# Patient Record
Sex: Female | Born: 1995 | Race: Black or African American | Hispanic: No | Marital: Single | Smoking: Never smoker
Health system: Southern US, Community
[De-identification: ages and names within clinical notes are randomized; demographics above are authoritative.]

## PROBLEM LIST (undated history)

## (undated) DIAGNOSIS — F419 Anxiety disorder, unspecified: Secondary | ICD-10-CM

## (undated) DIAGNOSIS — F32A Depression, unspecified: Secondary | ICD-10-CM

## (undated) HISTORY — PX: TONSILLECTOMY: SUR1361

## (undated) HISTORY — DX: Anxiety disorder, unspecified: F41.9

## (undated) HISTORY — PX: WISDOM TOOTH EXTRACTION: SHX21

## (undated) HISTORY — DX: Depression, unspecified: F32.A

---

## 2016-08-09 ENCOUNTER — Emergency Department (HOSPITAL_COMMUNITY)

## 2016-08-09 ENCOUNTER — Emergency Department (HOSPITAL_COMMUNITY)
Admission: EM | Admit: 2016-08-09 | Discharge: 2016-08-09 | Disposition: A | Discharge status: Home / Self Care | Attending: Emergency Medicine | Admitting: Emergency Medicine

## 2016-08-09 ENCOUNTER — Encounter (HOSPITAL_COMMUNITY): Payer: Self-pay

## 2016-08-09 DIAGNOSIS — R0789 Other chest pain: Secondary | ICD-10-CM | POA: Diagnosis present

## 2016-08-09 DIAGNOSIS — F419 Anxiety disorder, unspecified: Secondary | ICD-10-CM | POA: Insufficient documentation

## 2016-08-09 MED ORDER — ONDANSETRON 4 MG PO TBDP: 4.0000 mg | ORAL_TABLET | Freq: Three times a day (TID) | ORAL | 0 refills | Status: DC | PRN

## 2016-08-09 NOTE — ED Triage Notes (Signed)
Patient complains of right sided chest pain and shortness of breath since am, no ocls, no cough, no trauma. States increased stress

## 2016-08-09 NOTE — ED Notes (Signed)
Papers reviewed with patient, school note given and pt. Leaving with friend

## 2016-08-09 NOTE — Discharge Instructions (Signed)
Please read and follow all provided instructions.  Your diagnoses today include:  1. Chest tightness   2. Anxiety     Tests performed today include:  An EKG of your heart  A chest x-ray  Vital signs. See below for your results today.   Medications prescribed:   None  Take any prescribed medications only as directed.  Follow-up instructions: Please follow-up with your primary care provider as needed for further evaluation of your symptoms.   Please get plenty of rest over the next few days.   Return instructions:  SEEK IMMEDIATE MEDICAL ATTENTION IF:  You have severe chest pain, especially if the pain is crushing or pressure-like and spreads to the arms, back, neck, or jaw, or if you have sweating, nausea (feeling sick to your stomach), or shortness of breath. THIS IS AN EMERGENCY. Don't wait to see if the pain will go away. Get medical help at once. Call 911 or 0 (operator). DO NOT drive yourself to the hospital.   Your chest pain gets worse and does not go away with rest.   You have an attack of chest pain lasting longer than usual, despite rest and treatment with the medications your caregiver has prescribed.   You wake from sleep with chest pain or shortness of breath.  You feel dizzy or faint.  You have chest pain not typical of your usual pain for which you originally saw your caregiver.   You have any other emergent concerns regarding your health.  Additional Information: Chest pain comes from many different causes. Your caregiver has diagnosed you as having chest pain that is not specific for one problem, but does not require admission.  You are at low risk for an acute heart condition or other serious illness.   Your vital signs today were: BP 135/91 (BP Location: Right Arm)    Pulse 96    Temp 98.3 F (36.8 C) (Oral)    Resp 21    Ht 5\' 2"  (1.575 m)    Wt 106.1 kg    LMP 07/29/2016    SpO2 100%    BMI 42.76 kg/m  If your blood pressure (BP) was elevated  above 135/85 this visit, please have this repeated by your doctor within one month. --------------

## 2016-08-09 NOTE — ED Notes (Signed)
Pt speaking in complete sentences at nurse first, ambulatory with quick step.  NAD, A&O.

## 2016-08-09 NOTE — ED Provider Notes (Signed)
[facility]-EMERGENCY DEPT Provider Note   CSN: [id number] Arrival date & time: 08/09/16  1207  By signing my name below, I, Clovis PuAvnee Patel, attest that this documentation has been prepared under the direction and in the presence of  RaytheonJosh Bradie Lacock PA-C. Electronically Signed: Clovis PuAvnee Patel, ED Scribe. 08/09/16. 2:23 PM.   History   Chief Complaint Chief Complaint  Patient presents with  . Chest Pain  . Shortness of Breath   The history is provided by the patient. No language interpreter was used.   HPI Comments:  Mia Ryan is a 20 y.o. female who presents to the Emergency Department complaining of sudden onset, mild chest pain/pressure with associated SOB x which began in the AM today. Pt states she was hyperventilating and notes she has been stressed out due to finals x 4 days. She has not been sleeping much and has been drinking enerty drinks. She also endorses nausea. Pt denies fevers, cough, leg swelling, abdominal pain, hx of blood clots, birth control use, hx of hear or lungs issues, any other associated symptoms and modifying factors at this time.    History reviewed. No pertinent past medical history.  There are no active problems to display for this patient.   History reviewed. No pertinent surgical history.  OB History    No data available       Home Medications    Prior to Admission medications   Not on File    Family History No family history on file.  Social History Social History  Substance Use Topics  . Smoking status: Never Smoker  . Smokeless tobacco: Never Used  . Alcohol use Not on file     Allergies   Patient has no known allergies.   Review of Systems Review of Systems  Constitutional: Negative for diaphoresis and fever.  Eyes: Negative for redness.  Respiratory: Positive for shortness of breath. Negative for cough.   Cardiovascular: Positive for chest pain. Negative for palpitations and leg swelling.  Gastrointestinal: Positive for nausea.  Negative for abdominal pain and vomiting.  Genitourinary: Negative for dysuria.  Musculoskeletal: Negative for back pain and neck pain.  Skin: Negative for rash.  Neurological: Negative for syncope and light-headedness.  Psychiatric/Behavioral: The patient is nervous/anxious.      Physical Exam Updated Vital Signs BP 135/91 (BP Location: Right Arm)   Pulse 96   Temp 98.3 F (36.8 C) (Oral)   Resp 21   Ht 5\' 2"  (1.575 m)   Wt 233 lb 12.8 oz (106.1 kg)   LMP 07/29/2016   SpO2 100%   BMI 42.76 kg/m   Physical Exam  Constitutional: She appears well-developed and well-nourished. No distress.  HENT:  Head: Normocephalic and atraumatic.  Mouth/Throat: Oropharynx is clear and moist and mucous membranes are normal. Mucous membranes are not dry.  Eyes: Conjunctivae are normal.  Neck: Trachea normal and normal range of motion. Neck supple. Normal carotid pulses and no JVD present. No muscular tenderness present. Carotid bruit is not present. No tracheal deviation present.  Cardiovascular: Normal rate, regular rhythm, S1 normal, S2 normal, normal heart sounds and intact distal pulses.  Exam reveals no decreased pulses.   No murmur heard. Pulmonary/Chest: Effort normal. No respiratory distress. She has no wheezes. She exhibits no tenderness.  Abdominal: Soft. Normal aorta and bowel sounds are normal. She exhibits no distension. There is no tenderness. There is no rebound and no guarding.  Musculoskeletal: Normal range of motion.  Neurological: She is alert.  Skin: Skin is warm  and dry. She is not diaphoretic. No cyanosis. No pallor.  Psychiatric: She has a normal mood and affect.  Nursing note and vitals reviewed.    ED Treatments / Results  DIAGNOSTIC STUDIES:  Oxygen Saturation is 100% on RA, normal by my interpretation.    COORDINATION OF CARE:  2:21 PM Discussed EKG and CXR results with patient. Prescribed medication for nausea and advised pt to get some rest. Pt also advised  to stay away from caffeine. Discussed treatment plan with pt at bedside and pt agreed to plan.   Radiology Dg Chest 2 View  Result Date: 08/09/2016 CLINICAL DATA:  RIGHT upper chest pain. EXAM: CHEST  2 VIEW COMPARISON:  None. FINDINGS: The heart size and mediastinal contours are within normal limits. Both lungs are clear. The visualized skeletal structures are unremarkable. IMPRESSION: No active cardiopulmonary disease. Electronically Signed   By: Elsie StainJohn T Curnes M.D.   On: 08/09/2016 13:27    Procedures Procedures (including critical care time)    Initial Impression / Assessment and Plan / ED Course  I have reviewed the triage vital signs and the nursing notes.  Pertinent labs & imaging results that were available during my care of the patient were reviewed by me and considered in my medical decision making (see chart for details).  Clinical Course    Patient seen and examined.    Vital signs reviewed and are as follows: BP 135/91 (BP Location: Right Arm)   Pulse 96   Temp 98.3 F (36.8 C) (Oral)   Resp 21   Ht 5\' 2"  (1.575 m)   Wt 106.1 kg   LMP 07/29/2016   SpO2 100%   BMI 42.76 kg/m   EKG reviewed by myself.   ED ECG REPORT   Date: 08/09/2016  Rate: 88  Rhythm: normal sinus rhythm  QRS Axis: normal  Intervals: normal  ST/T Wave abnormalities: normal  Conduction Disutrbances: none  Narrative Interpretation:   Old EKG Reviewed: none available  I have personally reviewed the EKG tracing and agree with the computerized printout as noted.   Final Clinical Impressions(s) / ED Diagnoses   Final diagnoses:  Chest tightness  Anxiety   Patient with chest tightness in the setting of anxiety hyperventilation. EKG is normal. Chest x-ray is normal. Patient is under a lot of stress due to finals. She has not been sleeping much. She has large amount of caffeine intake. Suspect that her symptoms today are related to panic attack. Patient is perc negative. Do not suspect  ACS given age and presentation. Do not feel that blood work is indicated today.  New Prescriptions New Prescriptions   ONDANSETRON (ZOFRAN ODT) 4 MG DISINTEGRATING TABLET    Take 1 tablet (4 mg total) by mouth every 8 (eight) hours as needed for nausea or vomiting.  I personally performed the services described in this documentation, which was scribed in my presence. The recorded information has been reviewed and is accurate.     Renne CriglerJoshua Kevina Piloto, PA-C 08/09/16 1438    Nira ConnPedro Eduardo Cardama, MD 08/10/16 1146

## 2017-12-31 IMAGING — CR DG CHEST 2V
2 series · 2 of 2 positions shown · non-contrast
Comparison: None.

CLINICAL DATA: RIGHT upper chest pain.

EXAM:
CHEST  2 VIEW

[chest lat]
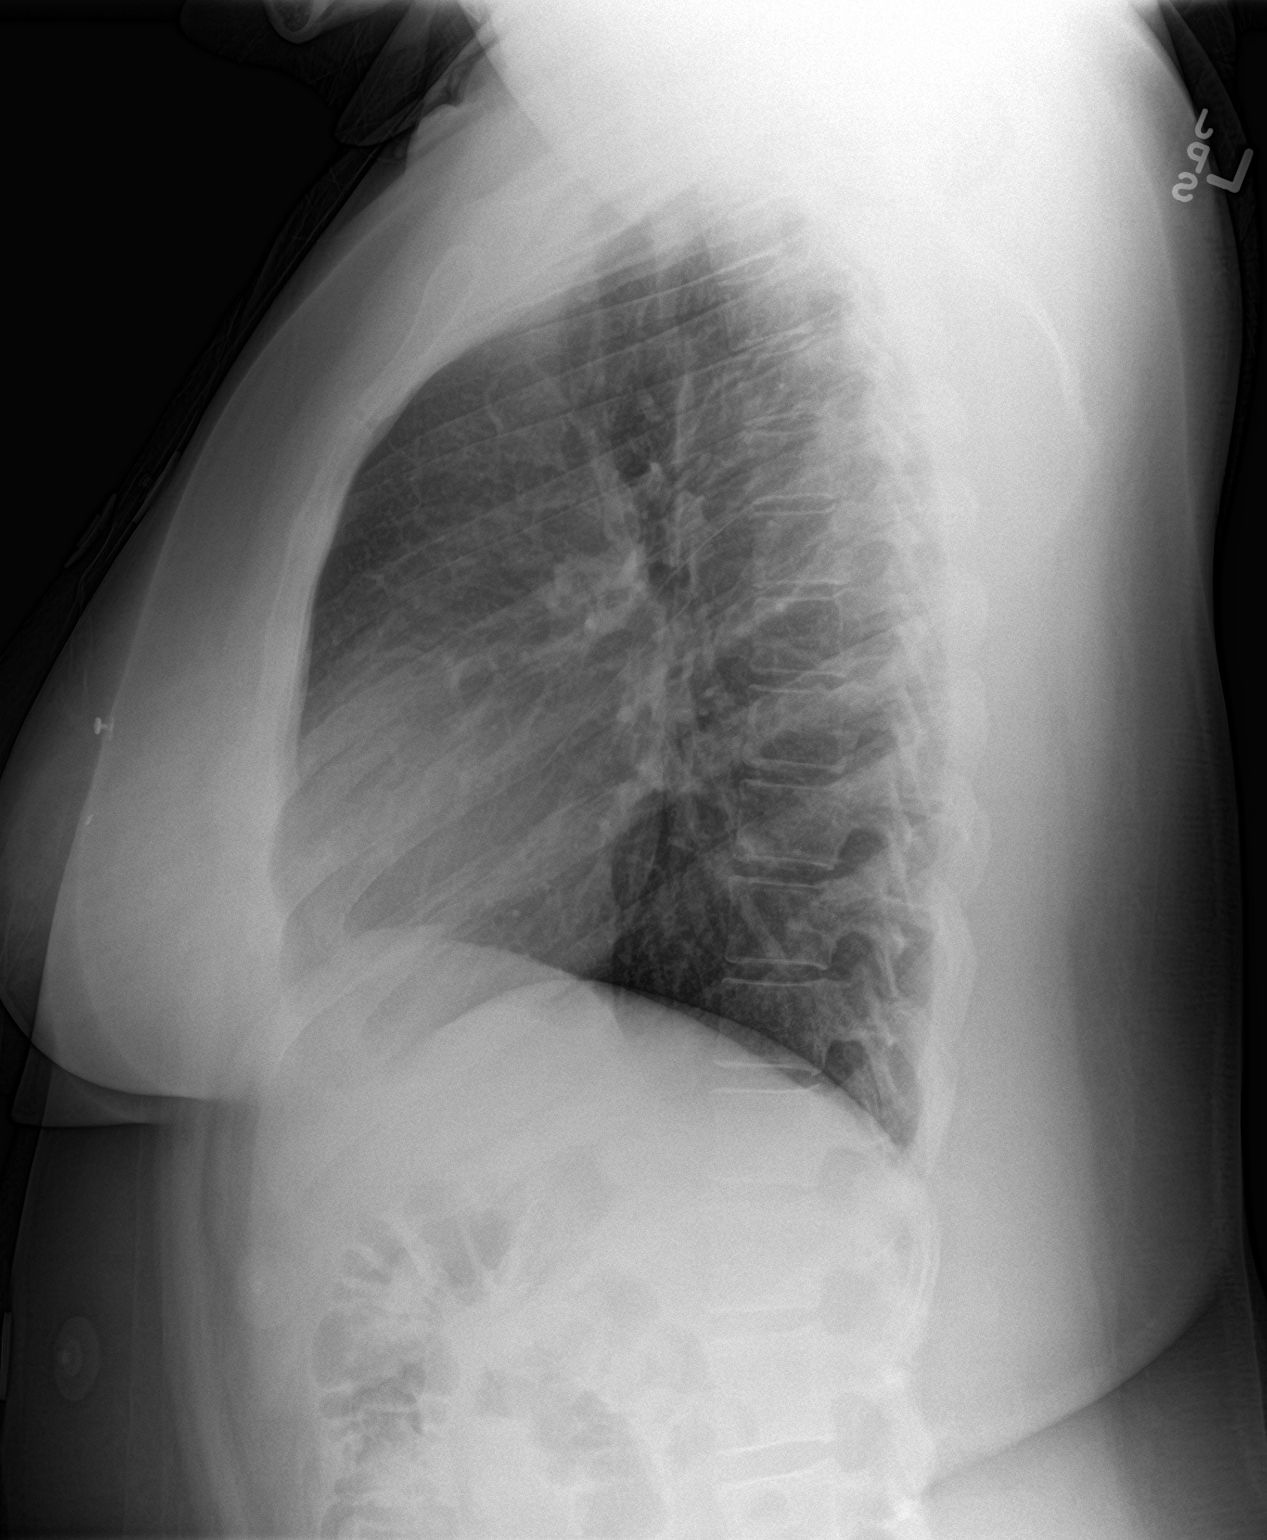

[chest pa]
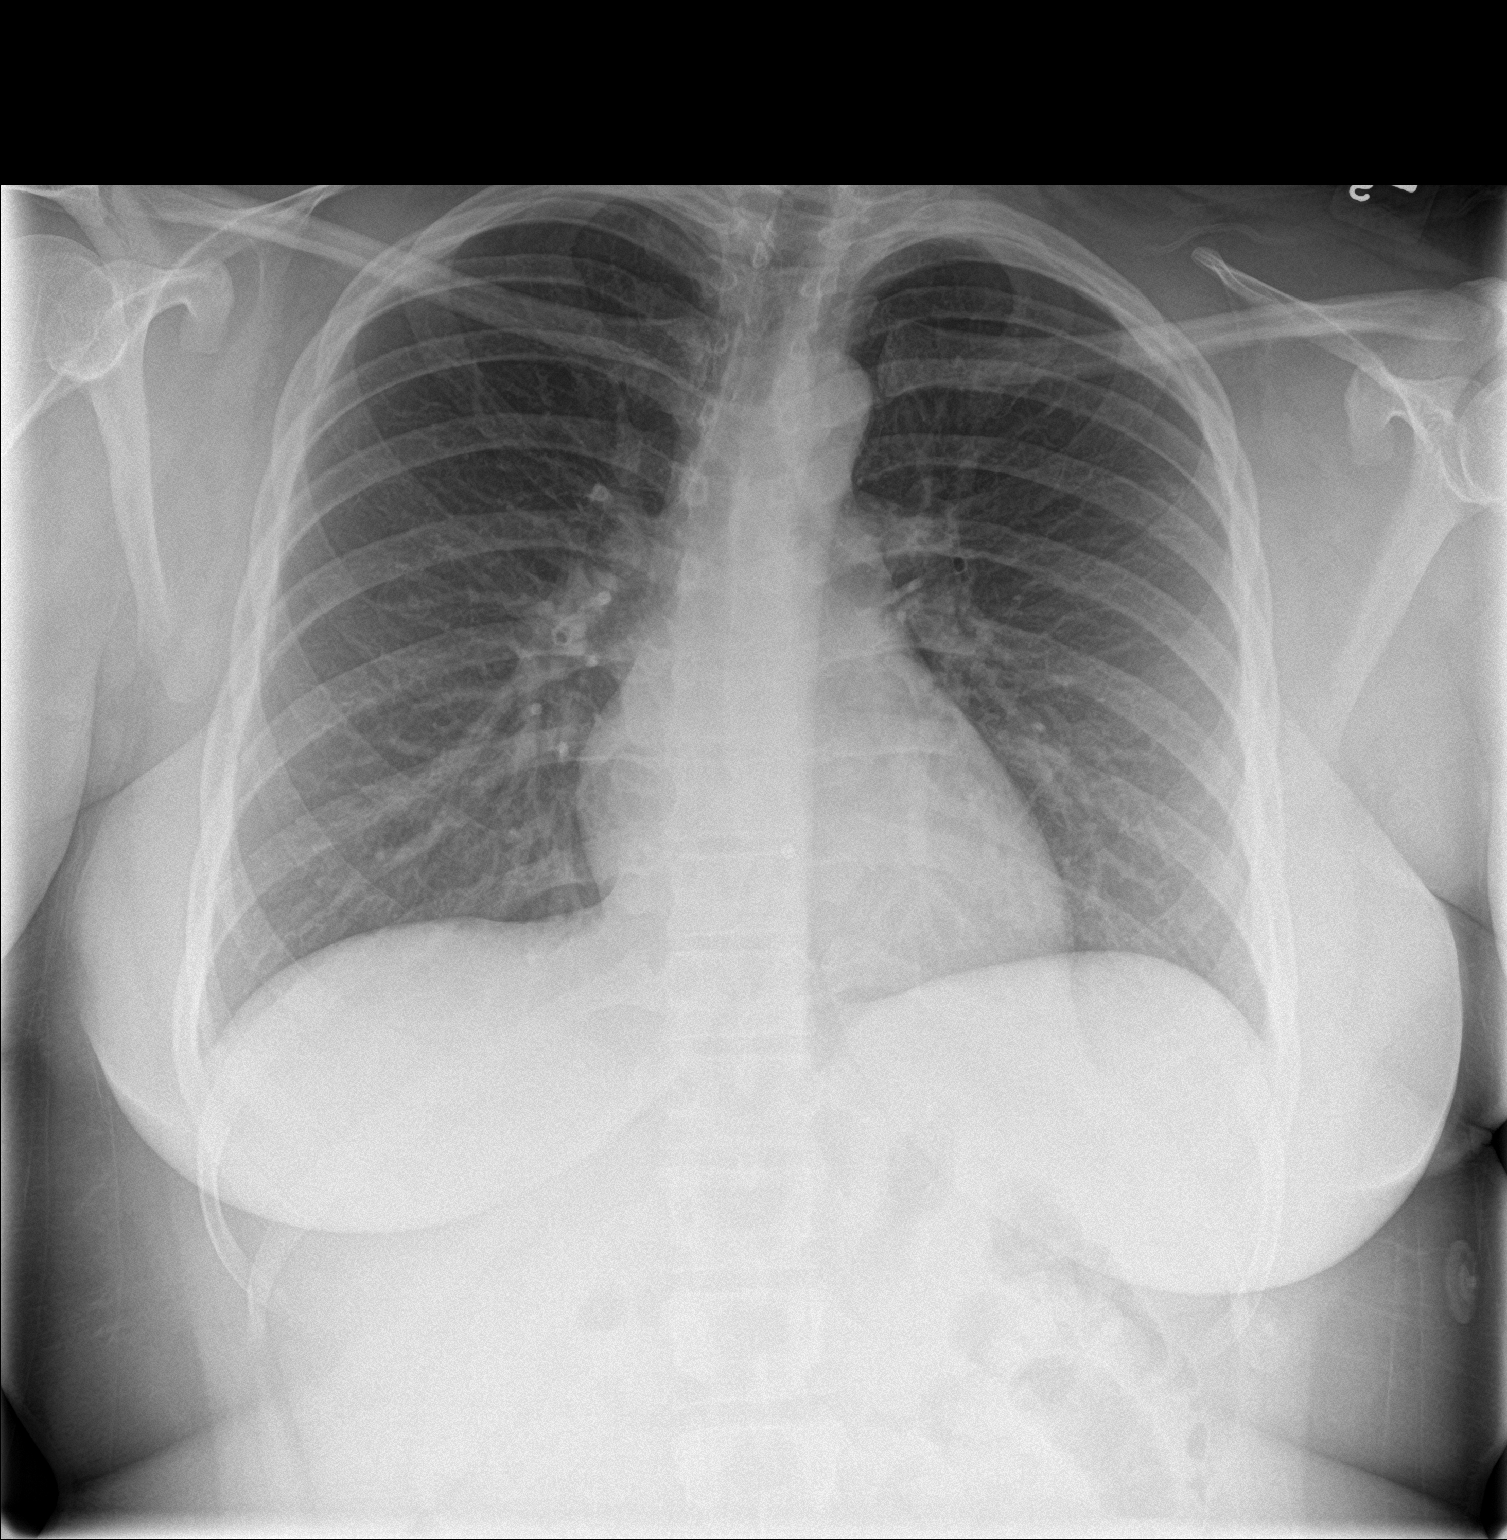

[2 of 2 positions shown; findings below may reference images not displayed]

FINDINGS: The heart size and mediastinal contours are within normal limits.
Both lungs are clear. The visualized skeletal structures are
unremarkable.
IMPRESSION: No active cardiopulmonary disease.

## 2018-09-04 NOTE — L&D Delivery Note (Signed)
Delivery Note At  a viable female was delivered via  (Presentation:roa ;roa  ).  APGAR:9 ,9 ; weight  .   Placenta status: complete, .  Cord:3V  with the following complications:bleeding from tear with repair with 4-0 vicryl with red rubber placement .    Anesthesia:  Epidural Episiotomy:  None Lacerations:  1st periurtheral and right label Suture Repair: vicryl 4-0 x 2 Est. Blood Loss (mL):  840  Mom to postpartum.  Baby to Couplet care / Skin to Skin.  Pleas Koch Doyce Stonehouse 06/03/2019, 3:09 AM

## 2018-10-22 LAB — OB RESULTS CONSOLE GC/CHLAMYDIA
Chlamydia: NEGATIVE
Gonorrhea: NEGATIVE

## 2018-10-22 LAB — OB RESULTS CONSOLE ABO/RH: RH Type: POSITIVE

## 2018-10-22 LAB — OB RESULTS CONSOLE ANTIBODY SCREEN: Antibody Screen: NEGATIVE

## 2018-10-22 LAB — OB RESULTS CONSOLE RPR: RPR: NONREACTIVE

## 2018-10-22 LAB — OB RESULTS CONSOLE RUBELLA ANTIBODY, IGM: Rubella: NON-IMMUNE/NOT IMMUNE

## 2018-10-22 LAB — OB RESULTS CONSOLE HIV ANTIBODY (ROUTINE TESTING): HIV: NONREACTIVE

## 2018-10-22 LAB — OB RESULTS CONSOLE HEPATITIS B SURFACE ANTIGEN: Hepatitis B Surface Ag: NEGATIVE

## 2019-04-30 LAB — OB RESULTS CONSOLE GBS: GBS: NEGATIVE

## 2019-05-26 ENCOUNTER — Encounter (HOSPITAL_COMMUNITY): Payer: Self-pay | Admitting: *Deleted

## 2019-05-26 ENCOUNTER — Telehealth (HOSPITAL_COMMUNITY): Payer: Self-pay | Admitting: *Deleted

## 2019-05-26 ENCOUNTER — Other Ambulatory Visit: Payer: Self-pay | Admitting: Obstetrics and Gynecology

## 2019-05-26 NOTE — Telephone Encounter (Signed)
Preadmission screen  

## 2019-05-28 ENCOUNTER — Telehealth (HOSPITAL_COMMUNITY): Payer: Self-pay | Admitting: *Deleted

## 2019-05-28 ENCOUNTER — Encounter (HOSPITAL_COMMUNITY): Payer: Self-pay | Admitting: *Deleted

## 2019-05-28 NOTE — Telephone Encounter (Signed)
Preadmission screen  

## 2019-05-31 ENCOUNTER — Other Ambulatory Visit (HOSPITAL_COMMUNITY)
Admission: RE | Admit: 2019-05-31 | Discharge: 2019-05-31 | Disposition: A | Discharge status: Home / Self Care | Payer: Medicaid Other | Source: Ambulatory Visit

## 2019-06-02 ENCOUNTER — Inpatient Hospital Stay (HOSPITAL_COMMUNITY): Payer: Medicaid Other | Admitting: Anesthesiology

## 2019-06-02 ENCOUNTER — Other Ambulatory Visit: Payer: Self-pay

## 2019-06-02 ENCOUNTER — Encounter (HOSPITAL_COMMUNITY): Payer: Self-pay

## 2019-06-02 ENCOUNTER — Inpatient Hospital Stay (HOSPITAL_COMMUNITY)
Admission: AD | Admit: 2019-06-02 | Discharge: 2019-06-05 | DRG: 807 | Disposition: A | Discharge status: Home / Self Care | Payer: Medicaid Other | Attending: Obstetrics and Gynecology | Admitting: Obstetrics and Gynecology

## 2019-06-02 ENCOUNTER — Inpatient Hospital Stay (HOSPITAL_COMMUNITY): Payer: Medicaid Other

## 2019-06-02 DIAGNOSIS — O99214 Obesity complicating childbirth: Principal | ICD-10-CM | POA: Diagnosis present

## 2019-06-02 DIAGNOSIS — Z3A40 40 weeks gestation of pregnancy: Secondary | ICD-10-CM

## 2019-06-02 DIAGNOSIS — O99213 Obesity complicating pregnancy, third trimester: Secondary | ICD-10-CM | POA: Diagnosis present

## 2019-06-02 DIAGNOSIS — Z20828 Contact with and (suspected) exposure to other viral communicable diseases: Secondary | ICD-10-CM | POA: Diagnosis present

## 2019-06-02 DIAGNOSIS — O99824 Streptococcus B carrier state complicating childbirth: Secondary | ICD-10-CM | POA: Diagnosis present

## 2019-06-02 LAB — CBC
HCT: 35.7 % — ABNORMAL LOW (ref 36.0–46.0)
Hemoglobin: 11.8 g/dL — ABNORMAL LOW (ref 12.0–15.0)
MCH: 28 pg (ref 26.0–34.0)
MCHC: 33.1 g/dL (ref 30.0–36.0)
MCV: 84.6 fL (ref 80.0–100.0)
Platelets: 273 10*3/uL (ref 150–400)
RBC: 4.22 MIL/uL (ref 3.87–5.11)
RDW: 14.6 % (ref 11.5–15.5)
WBC: 10.1 10*3/uL (ref 4.0–10.5)
nRBC: 0 % (ref 0.0–0.2)

## 2019-06-02 LAB — TYPE AND SCREEN
ABO/RH(D): O POS
Antibody Screen: NEGATIVE

## 2019-06-02 LAB — SARS CORONAVIRUS 2 BY RT PCR (HOSPITAL ORDER, PERFORMED IN ~~LOC~~ HOSPITAL LAB): SARS Coronavirus 2: NEGATIVE

## 2019-06-02 LAB — ABO/RH: ABO/RH(D): O POS

## 2019-06-02 MED ORDER — OXYTOCIN 40 UNITS IN NORMAL SALINE INFUSION - SIMPLE MED
1.0000 m[IU]/min | INTRAVENOUS | Status: DC
  Administered 2019-06-02: 2 m[IU]/min via INTRAVENOUS
  Filled 2019-06-02: qty 1000

## 2019-06-02 MED ORDER — ONDANSETRON HCL 4 MG/2ML IJ SOLN: 4.0000 mg | Freq: Four times a day (QID) | INTRAMUSCULAR | Status: DC | PRN

## 2019-06-02 MED ORDER — TERBUTALINE SULFATE 1 MG/ML IJ SOLN: 0.2500 mg | Freq: Once | INTRAMUSCULAR | Status: DC | PRN

## 2019-06-02 MED ORDER — MISOPROSTOL 25 MCG QUARTER TABLET
25.0000 ug | ORAL_TABLET | ORAL | Status: DC | PRN
  Administered 2019-06-02 (×3): 25 ug via VAGINAL
  Filled 2019-06-02 (×3): qty 1

## 2019-06-02 MED ORDER — LIDOCAINE HCL (PF) 1 % IJ SOLN: 30.0000 mL | INTRAMUSCULAR | Status: DC | PRN

## 2019-06-02 MED ORDER — SODIUM CHLORIDE 0.9 % IV SOLN
5.0000 10*6.[IU] | Freq: Once | INTRAVENOUS | Status: AC
  Administered 2019-06-02: 5 10*6.[IU] via INTRAVENOUS
  Filled 2019-06-02: qty 5

## 2019-06-02 MED ORDER — PENICILLIN G 3 MILLION UNITS IVPB - SIMPLE MED
3.0000 10*6.[IU] | INTRAVENOUS | Status: DC
  Administered 2019-06-02 – 2019-06-03 (×6): 3 10*6.[IU] via INTRAVENOUS
  Filled 2019-06-02 (×6): qty 100

## 2019-06-02 MED ORDER — SOD CITRATE-CITRIC ACID 500-334 MG/5ML PO SOLN: 30.0000 mL | ORAL | Status: DC | PRN

## 2019-06-02 MED ORDER — OXYTOCIN 40 UNITS IN NORMAL SALINE INFUSION - SIMPLE MED: 2.5000 [IU]/h | INTRAVENOUS | Status: DC

## 2019-06-02 MED ORDER — FLEET ENEMA 7-19 GM/118ML RE ENEM: 1.0000 | ENEMA | RECTAL | Status: DC | PRN

## 2019-06-02 MED ORDER — LACTATED RINGERS IV SOLN
500.0000 mL | INTRAVENOUS | Status: DC | PRN
  Administered 2019-06-02: 20:00:00 500 mL via INTRAVENOUS

## 2019-06-02 MED ORDER — ACETAMINOPHEN 325 MG PO TABS: 650.0000 mg | ORAL_TABLET | ORAL | Status: DC | PRN

## 2019-06-02 MED ORDER — SODIUM CHLORIDE (PF) 0.9 % IJ SOLN
INTRAMUSCULAR | Status: DC | PRN
  Administered 2019-06-02: 12 mL/h via EPIDURAL

## 2019-06-02 MED ORDER — OXYTOCIN 40 UNITS IN NORMAL SALINE INFUSION - SIMPLE MED: 1.0000 m[IU]/min | INTRAVENOUS | Status: DC

## 2019-06-02 MED ORDER — LIDOCAINE HCL (PF) 1 % IJ SOLN
INTRAMUSCULAR | Status: DC | PRN
  Administered 2019-06-02: 6 mL via EPIDURAL

## 2019-06-02 MED ORDER — FENTANYL CITRATE (PF) 100 MCG/2ML IJ SOLN
50.0000 ug | INTRAMUSCULAR | Status: DC | PRN
  Administered 2019-06-02: 100 ug via INTRAVENOUS
  Filled 2019-06-02: qty 2

## 2019-06-02 MED ORDER — OXYTOCIN BOLUS FROM INFUSION
500.0000 mL | Freq: Once | INTRAVENOUS | Status: AC
  Administered 2019-06-03: 500 mL via INTRAVENOUS

## 2019-06-02 MED ORDER — FENTANYL-BUPIVACAINE-NACL 0.5-0.125-0.9 MG/250ML-% EP SOLN
EPIDURAL | Status: AC
  Filled 2019-06-02: qty 250

## 2019-06-02 MED ORDER — LACTATED RINGERS IV SOLN
INTRAVENOUS | Status: DC
  Administered 2019-06-02 (×3): via INTRAVENOUS

## 2019-06-02 NOTE — Progress Notes (Signed)
Mia Ryan is a 23 y.o. G1P0 at 60w0dadmitted for induction of labor due to maternal obesity with BMI 41.  Subjective:  Currently on Pitocin 6 mU/min Comfortable with  Labor support without medications Contractions every 2-4 minutes, lasting 30-40 seconds, intensity 5/10    Objective: BP (!) 141/87   Pulse 74   Temp 98.1 F (36.7 C) (Oral)   Resp 16   Ht 5\' 4"  (1.626 m)   Wt 108.1 kg   LMP 08/26/2018   BMI 40.92 kg/m  No intake/output data recorded. No intake/output data recorded.  FHT:  Category 1 SVE:   Dilation: 3 Effacement (%): 50 Station: -1 Exam by:: Cypress Fanfan   AROM with clear fluid  Labs: Lab Results  Component Value Date   WBC 10.1 06/02/2019   HGB 11.8 (L) 06/02/2019   HCT 35.7 (L) 06/02/2019   MCV 84.6 06/02/2019   PLT 273 06/02/2019    Assessment / Plan: Induction of labor due to maternal obesity,  progressing well on pitocin Fetal Wellbeing: reassuring Anticipated MOD:  NSVD  Katharine Look A Colonel Krauser 06/02/2019, 5:26 PM

## 2019-06-02 NOTE — Anesthesia Preprocedure Evaluation (Signed)
Anesthesia Evaluation  Patient identified by MRN, date of birth, ID band Patient awake    Reviewed: Allergy & Precautions, H&P , NPO status , Patient's Chart, lab work & pertinent test results, reviewed documented beta blocker date and time   Airway Mallampati: I  TM Distance: >3 FB Neck ROM: full    Dental no notable dental hx. (+) Teeth Intact   Pulmonary neg pulmonary ROS,    Pulmonary exam normal breath sounds clear to auscultation       Cardiovascular negative cardio ROS Normal cardiovascular exam Rhythm:regular Rate:Normal     Neuro/Psych negative neurological ROS  negative psych ROS   GI/Hepatic negative GI ROS, Neg liver ROS,   Endo/Other  Morbid obesity  Renal/GU negative Renal ROS  negative genitourinary   Musculoskeletal   Abdominal (+) + obese,   Peds  Hematology negative hematology ROS (+)   Anesthesia Other Findings   Reproductive/Obstetrics (+) Pregnancy                             Anesthesia Physical Anesthesia Plan  ASA: III  Anesthesia Plan: Epidural   Post-op Pain Management:    Induction:   PONV Risk Score and Plan:   Airway Management Planned:   Additional Equipment:   Intra-op Plan:   Post-operative Plan:   Informed Consent: I have reviewed the patients History and Physical, chart, labs and discussed the procedure including the risks, benefits and alternatives for the proposed anesthesia with the patient or authorized representative who has indicated his/her understanding and acceptance.       Plan Discussed with: Anesthesiologist  Anesthesia Plan Comments:         Anesthesia Quick Evaluation

## 2019-06-02 NOTE — H&P (Signed)
Mia Ryan is a 23 y.o. female, G1P0 at 2 weeks, presenting for induction of labor due to maternal obesity affected pregnancy.  Prenatal hx remarkable for treated for BV,  rubella nonimmune, GBS positive and smoker.  Patient Active Problem List   Diagnosis Date Noted  . Obesity affecting pregnancy in third trimester 06/02/2019    History of present pregnancy: Patient entered care at 12 weeks.   EDC of 06/02/2019 was established by LMP.   Anatomy scan:  19 weeks, with normal findings and an posterior placenta.   Additional [place] evaluations:  Every 4 weeks for growth and weekly BPP after 32 weeks.  All US wnl.  Growth at 32 weeks was 39 percentile.   Significant prenatal events: None   Last evaluation: Last week  OB History    Gravida  1   Para      Term      Preterm      AB      Living        SAB      TAB      Ectopic      Multiple      Live Births             History reviewed. No pertinent past medical history. Past Surgical History:  Procedure Laterality Date  . TONSILLECTOMY    . WISDOM TOOTH EXTRACTION     Family History: family history is not on file. Social History:  reports that she has never smoked. She has never used smokeless tobacco. She reports previous alcohol use. She reports that she does not use drugs.   Prenatal Transfer Tool  Maternal Diabetes: No Genetic Screening: Normal Maternal Ultrasounds/Referrals: Normal Fetal Ultrasounds or other Referrals:  None Maternal Substance Abuse:  No Significant Maternal Medications:  None Significant Maternal Lab Results: Group B Strep positive  TDAP UTD Flu Declined  ROS: All 10 systems reviewed and negative except as stated above.  No Known Allergies   Dilation: Fingertip Effacement (%): Thick Station: -3 Exam by:: Ola Spurr, RN and Blanch Media, RN Blood pressure 137/85, pulse 91, temperature 98.5 F (36.9 C), temperature source Oral, resp. rate 15, height 5\' 4"  (1.626 m), weight  108.1 kg, last menstrual period 08/26/2018.  Chest clear Heart RRR without murmur Abd gravid, NT, FH appropriate Pelvic: Per RN Ext: Neg  FHR: Category 1 FHT 120   accels no decels UCs: irregular  Prenatal labs: ABO, Rh: --/--/O POS, O POS Performed at [facility] [address] [place], [place] [address]  [phone]/28 0123) Antibody: NEG (09/28 0123) Rubella:  Nonimmune (02/18 0000) RPR: Nonreactive (02/18 0000)  HBsAg: Negative (02/18 0000)  HIV: Non-reactive (02/18 0000)  GBS: Negative/-- (08/26 0000) Sickle cell/Hgb electrophoresis:  AA Pap:  11/19/2018 Negative GC:  Negative Chlamydia:  Negative Genetic screenings:  NIPT neg Glucola:  160 Other:  Three hour GTT wnl Hgb 13.3 at NOB, 12.1 at 28 weeks       Assessment/Plan: IUP at 40 weeks for induction for maternal  Obesity Cat 1 strip Rubella non-immune GBS positive  Plan: Admit to [facility]  Routine [facility] orders Pain med/epidural prn PCN G for GBS prophylaxis  Cytotec for cervical  ripening  Pleas Koch ProtheroCNM, MSN 06/02/2019, 5:31 AM

## 2019-06-02 NOTE — Progress Notes (Signed)
Mia Ryan is a 23 y.o. G1P0 at 30w0dadmitted for induction of labor due to maternal obesity with BMI 41.  Subjective:  3rd Cytotec dose placed at 10:00 Comfortable with  Labor support without medications Contractions every 3-6 minutes, lasting 30 seconds, intensity 2/10    Objective: BP (!) 141/89   Pulse 80   Temp 98 F (36.7 C)   Resp 16   Ht 5\' 4"  (1.626 m)   Wt 108.1 kg   LMP 08/26/2018   BMI 40.92 kg/m  No intake/output data recorded. No intake/output data recorded.  FHT:  Category 1 SVE:   Dilation: 1.5 Effacement (%): 50 Station: -2 Exam by:: m wilkins rnc  Labs: Lab Results  Component Value Date   WBC 10.1 06/02/2019   HGB 11.8 (L) 06/02/2019   HCT 35.7 (L) 06/02/2019   MCV 84.6 06/02/2019   PLT 273 06/02/2019    Assessment / Plan: Induction of labor due to maternal obesity,  progressing well on pitocin Fetal Wellbeing: reassuring Anticipated MOD:  NSVD  Mia Ryan 06/02/2019, 11:03 AM

## 2019-06-02 NOTE — Anesthesia Procedure Notes (Signed)
Epidural Patient location during procedure: OB Start time: 06/02/2019 8:19 PM End time: 06/02/2019 8:24 PM  Staffing Anesthesiologist: Janeece Riggers, MD  Preanesthetic Checklist Completed: patient identified, site marked, surgical consent, pre-op evaluation, timeout performed, IV checked, risks and benefits discussed and monitors and equipment checked  Epidural Patient position: sitting Prep: site prepped and draped and DuraPrep Patient monitoring: continuous pulse ox and blood pressure Approach: midline Location: L3-L4 Injection technique: LOR air  Needle:  Needle type: Tuohy  Needle gauge: 17 G Needle length: 9 cm and 9 Needle insertion depth: 9 cm Catheter type: closed end flexible Catheter size: 19 Gauge Catheter at skin depth: 14 cm Test dose: negative  Assessment Events: blood not aspirated, injection not painful, no injection resistance, negative IV test and no paresthesia

## 2019-06-03 ENCOUNTER — Encounter (HOSPITAL_COMMUNITY): Payer: Self-pay

## 2019-06-03 LAB — CBC
HCT: 34.1 % — ABNORMAL LOW (ref 36.0–46.0)
Hemoglobin: 11.1 g/dL — ABNORMAL LOW (ref 12.0–15.0)
MCH: 27.7 pg (ref 26.0–34.0)
MCHC: 32.6 g/dL (ref 30.0–36.0)
MCV: 85 fL (ref 80.0–100.0)
Platelets: 284 10*3/uL (ref 150–400)
RBC: 4.01 MIL/uL (ref 3.87–5.11)
RDW: 14.5 % (ref 11.5–15.5)
WBC: 11.6 10*3/uL — ABNORMAL HIGH (ref 4.0–10.5)
nRBC: 0 % (ref 0.0–0.2)

## 2019-06-03 LAB — CREATININE, SERUM
Creatinine, Ser: 0.84 mg/dL (ref 0.44–1.00)
GFR calc Af Amer: >60 mL/min (ref >60)
GFR calc non Af Amer: >60 mL/min (ref >60)

## 2019-06-03 LAB — RPR: RPR Ser Ql: NONREACTIVE — AB

## 2019-06-03 MED ORDER — ENOXAPARIN SODIUM 40 MG/0.4ML ~~LOC~~ SOLN: 30.0000 mg | SUBCUTANEOUS | Status: DC

## 2019-06-03 MED ORDER — ENOXAPARIN SODIUM 60 MG/0.6ML ~~LOC~~ SOLN
50.0000 mg | SUBCUTANEOUS | Status: DC
  Administered 2019-06-03: 50 mg via SUBCUTANEOUS
  Filled 2019-06-03 (×2): qty 0.6

## 2019-06-03 MED ORDER — ONDANSETRON HCL 4 MG/2ML IJ SOLN: 4.0000 mg | INTRAMUSCULAR | Status: DC | PRN

## 2019-06-03 MED ORDER — ACETAMINOPHEN 325 MG PO TABS: 650.0000 mg | ORAL_TABLET | ORAL | Status: DC | PRN

## 2019-06-03 MED ORDER — SENNOSIDES-DOCUSATE SODIUM 8.6-50 MG PO TABS
2.0000 | ORAL_TABLET | ORAL | Status: DC
  Administered 2019-06-03 – 2019-06-04 (×2): 2 via ORAL
  Filled 2019-06-03 (×2): qty 2

## 2019-06-03 MED ORDER — PRENATAL MULTIVITAMIN CH
1.0000 | ORAL_TABLET | Freq: Every day | ORAL | Status: DC
  Administered 2019-06-03 – 2019-06-05 (×3): 1 via ORAL
  Filled 2019-06-03 (×3): qty 1

## 2019-06-03 MED ORDER — DIPHENHYDRAMINE HCL 25 MG PO CAPS: 25.0000 mg | ORAL_CAPSULE | Freq: Four times a day (QID) | ORAL | Status: DC | PRN

## 2019-06-03 MED ORDER — COCONUT OIL OIL
1.0000 "application " | TOPICAL_OIL | Status: DC | PRN
  Administered 2019-06-05: 1 via TOPICAL

## 2019-06-03 MED ORDER — SIMETHICONE 80 MG PO CHEW: 80.0000 mg | CHEWABLE_TABLET | ORAL | Status: DC | PRN

## 2019-06-03 MED ORDER — BENZOCAINE-MENTHOL 20-0.5 % EX AERO: 1.0000 "application " | INHALATION_SPRAY | CUTANEOUS | Status: DC | PRN

## 2019-06-03 MED ORDER — WITCH HAZEL-GLYCERIN EX PADS: 1.0000 "application " | MEDICATED_PAD | CUTANEOUS | Status: DC | PRN

## 2019-06-03 MED ORDER — TETANUS-DIPHTH-ACELL PERTUSSIS 5-2.5-18.5 LF-MCG/0.5 IM SUSP: 0.5000 mL | Freq: Once | INTRAMUSCULAR | Status: DC

## 2019-06-03 MED ORDER — IBUPROFEN 600 MG PO TABS
600.0000 mg | ORAL_TABLET | Freq: Four times a day (QID) | ORAL | Status: DC
  Administered 2019-06-03 – 2019-06-05 (×11): 600 mg via ORAL
  Filled 2019-06-03 (×11): qty 1

## 2019-06-03 MED ORDER — ONDANSETRON HCL 4 MG PO TABS: 4.0000 mg | ORAL_TABLET | ORAL | Status: DC | PRN

## 2019-06-03 MED ORDER — DIBUCAINE (PERIANAL) 1 % EX OINT: 1.0000 "application " | TOPICAL_OINTMENT | CUTANEOUS | Status: DC | PRN

## 2019-06-03 MED ORDER — ZOLPIDEM TARTRATE 5 MG PO TABS: 5.0000 mg | ORAL_TABLET | Freq: Every evening | ORAL | Status: DC | PRN

## 2019-06-03 NOTE — Lactation Note (Signed)
This note was copied from a baby's chart. Lactation Consultation Note Baby 3 hrs old. RN assisted in latching. Mom has large elongated nipples. Baby latched well in laid back position. Mom has thick nipple tissue. No swallows heard. Before discharge assess for transfer.  Newborn behavior, STS, I&O, hand expression, breast massage, supply and demand discussed. Mom encouraged to feed baby 8-12 times/24 hours and with feeding cues.  Mom appears to be excited. Hand expression demonstrated w/glisten of colostrum noted. Encouraged mom to call for assistance or questions. Lactation brochure given.  Patient Name: Mia Ryan [id number] Date: 06/03/2019 Reason for consult: Initial assessment;Term;Primapara   Maternal Data Has patient been taught Hand Expression?: Yes Does the patient have breastfeeding experience prior to this delivery?: No  Feeding Feeding Type: Breast Fed  LATCH Score Latch: Grasps breast easily, tongue down, lips flanged, rhythmical sucking.  Audible Swallowing: A few with stimulation  Type of Nipple: Everted at rest and after stimulation  Comfort (Breast/Nipple): Soft / non-tender  Hold (Positioning): Assistance needed to correctly position infant at breast and maintain latch.  LATCH Score: 8  Interventions Interventions: Breast feeding basics reviewed;Adjust position;Assisted with latch;Support pillows;Skin to skin;Position options;Breast massage;Hand express;Breast compression  Lactation Tools Discussed/Used WIC Program: Yes   Consult Status Consult Status: Follow-up Date: 06/04/19 Follow-up type: In-patient    Theodoro Kalata 06/03/2019, 6:26 AM

## 2019-06-03 NOTE — Anesthesia Postprocedure Evaluation (Signed)
Anesthesia Post Note  Patient: Mia Ryan  Procedure(s) Performed: AN AD HOC LABOR EPIDURAL     Patient location during evaluation: Mother Baby Anesthesia Type: Epidural Level of consciousness: awake and alert Pain management: pain level controlled Vital Signs Assessment: post-procedure vital signs reviewed and stable Respiratory status: spontaneous breathing, nonlabored ventilation and respiratory function stable Cardiovascular status: stable Postop Assessment: no headache, no backache and epidural receding Anesthetic complications: no    Last Vitals:  Vitals:   06/03/19 0440 06/03/19 0615  BP: 127/88 116/84  Pulse: 83 98  Resp:  18  Temp: (!) 38.1 C 37 C  SpO2: 99% 99%    Last Pain:  Vitals:   06/03/19 0615  TempSrc: Oral  PainSc: 0-No pain   Pain Goal:                   Kelton Bultman

## 2019-06-03 NOTE — Progress Notes (Signed)
PPD #0 SVD w. 1st degree perineal, labial and periurethral lacerations  S:  Reports feeling no pain             Tolerating po/ No nausea or vomiting             Bleeding is moderate, one clot this am             Pain controlled with acetaminophen and ibuprofen (OTC)             Up ad lib / ambulatory / voiding w/o difficulty  Newborn Breast / Circumcision plans out pt @ [facility]  O:               VS: BP 130/79 (BP Location: Left Arm)   Pulse 100   Temp 98.4 F (36.9 C) (Oral)   Resp 18   Ht 5\' 4"  (1.626 m)   Wt 108.1 kg   LMP 08/26/2018   SpO2 100%   Breastfeeding Unknown   BMI 40.92 kg/m    LABS:              Recent Labs    06/02/19 0123 06/03/19 0609  WBC 10.1 11.6*  HGB 11.8* 11.1*  PLT 273 284               Blood type: --/--/O POS, O POS Performed at [facility], [place] [address] [place], [place] [address]  [phone]/28 0123)  Rubella: Nonimmune (02/18 0000)                     I&O: Intake/Output      09/28 0701 - 09/29 0700 09/29 0701 - 09/30 0700   I.V. (mL/kg) 2845.8 (26.3)    IV Piggyback 600    Total Intake(mL/kg) 3445.8 (31.9)    Urine (mL/kg/hr) 1600 (0.6)    Blood 841    Total Output 2441    Net +1004.8         Urine Occurrence  1 x                 Physical Exam:             Alert and oriented X3  Lungs: Clear and unlabored  Heart: regular rate and rhythm / no mumurs  Abdomen: soft, non-tender, non-distended              Fundus: firm, non-tender  Perineum: non-edematous  Lochia: appropriate  Extremities: no edema, no calf pain or tenderness    A: PPD # 0   Doing well - stable status  IOL for BMI  P: Routine post partum orders  Prophylactic Lovenox  Anticipate DC 06/04/19  Burman Foster, MSN, CNM 06/03/2019 1:32 PM

## 2019-06-04 MED ORDER — MEASLES, MUMPS & RUBELLA VAC IJ SOLR
0.5000 mL | Freq: Once | INTRAMUSCULAR | Status: AC
  Administered 2019-06-05: 0.5 mL via SUBCUTANEOUS
  Filled 2019-06-04: qty 0.5

## 2019-06-04 MED ORDER — IBUPROFEN 600 MG PO TABS: 600.0000 mg | ORAL_TABLET | Freq: Four times a day (QID) | ORAL | 0 refills | Status: DC

## 2019-06-04 NOTE — Discharge Summary (Addendum)
OB Discharge Summary     Patient Name: Mia Ryan DOB: 05-03-1996 MRN: [id number]  Date of admission: 06/02/2019 Delivering MD: Kenney Houseman   Date of discharge: 06/04/2019  Admitting diagnosis: INDUCTION Intrauterine pregnancy: [redacted]w[redacted]d     Secondary diagnosis:  Principal Problem:   SVD (9/29) Active Problems:   Obesity affecting pregnancy in third trimester  Additional problems: none     Discharge diagnosis: Term Pregnancy Delivered                                                                                                Post partum procedures:none  Augmentation: AROM, Pitocin and Cytotec  Complications: None  Hospital course:  Induction of Labor With Vaginal Delivery   23 y.o. yo G1P1001 at [redacted]w[redacted]d was admitted to the hospital 06/02/2019 for induction of labor.  Indication for induction: BMI.  Patient had an uncomplicated labor course as follows: Membrane Rupture Time/Date: 5:12 PM ,06/02/2019   Intrapartum Procedures: Episiotomy: None [1]                                         Lacerations:  1st degree [2];Labial [10];Periurethral [8]  Patient had delivery of a Viable female infant.  Information for the patient's newborn:  Brylan, Seubert [[id number]]      06/03/2019  Details of delivery can be found in separate delivery note.  Patient had a routine postpartum course. Patient is discharged home 06/04/19.  Physical exam  Vitals:   06/03/19 1015 06/03/19 1430 06/03/19 2217 06/04/19 0500  BP: 130/79 136/79 (!) 148/84 128/77  Pulse: 100 89 86 89  Resp: 18 18 16 17   Temp: 98.4 F (36.9 C) 98.5 F (36.9 C) 98.7 F (37.1 C) 97.7 F (36.5 C)  TempSrc: Oral Oral Oral Oral  SpO2: 100% 99% 100% 99%  Weight:      Height:       General: alert, cooperative and no distress Lochia: appropriate Uterine Fundus: firm Perineum: Well-approximated, significant erythema DVT Evaluation: Negative Homan's sign. No cords or calf tenderness. No significant calf/ankle  edema. Labs: Lab Results  Component Value Date   WBC 11.6 (H) 06/03/2019   HGB 11.1 (L) 06/03/2019   HCT 34.1 (L) 06/03/2019   MCV 85.0 06/03/2019   PLT 284 06/03/2019   CMP Latest Ref Rng & Units 06/03/2019  Creatinine 0.44 - 1.00 mg/dL 06/05/2019    Discharge instruction: per After Visit Summary and "Baby and Me Booklet".  After visit meds:  Allergies as of 06/04/2019   No Known Allergies     Medication List    STOP taking these medications   metoCLOPramide 10 MG tablet Commonly known as: REGLAN   ondansetron 4 MG disintegrating tablet Commonly known as: Zofran ODT   ondansetron 4 MG tablet Commonly known as: ZOFRAN   pantoprazole 40 MG tablet Commonly known as: PROTONIX   prenatal multivitamin Tabs tablet   promethazine 25 MG tablet Commonly known as: PHENERGAN     TAKE these medications   ibuprofen  600 MG tablet Commonly known as: ADVIL Take 1 tablet (600 mg total) by mouth every 6 (six) hours.       Diet: routine diet  Activity: Advance as tolerated. Pelvic rest for 6 weeks.   Outpatient follow up:6 weeks Follow up Appt:No future appointments. Follow up Visit:No follow-ups on file.  Postpartum contraception: IUD Mirena  Newborn Data: Live born female "[place]" Birth Weight: 6 lb 11.4 oz (3045 g) APGAR: 9, 9  Newborn Delivery   Birth date/time: 06/03/2019 02:33:00 Delivery type: Vaginal, Spontaneous      Baby Feeding: Breast Disposition:home with mother  Circumcision: Out-patient @ [facility]   Burman Foster MSN, CNM 06/04/2019 6:45 AM   Addendum: Discharge for 06/04/19 deferred for breastfeeding support. Pt in stable condition and may be discharged today. Pt may room-in if infant cannot be discharged.   Burman Foster, MSN, CNM 06/05/2019 8:52 AM

## 2019-06-04 NOTE — Lactation Note (Signed)
This note was copied from a baby's chart. Lactation Consultation Note  Patient Name: Mia Ryan [id number] Date: 06/04/2019 Reason for consult: Follow-up assessment  P1 mother whose infant is now 70 hours old.   Baby was awake in father's arms when I arrived.  He seems to be somewhat restless at times.    Mother's breasts are large, soft and non tender and nipples are large, short shafted and intact.  While I was visiting with mother baby became fussy and was acting hungry.  Offered to assist with latching.  Mother has not been feeding him STS and I suggested continuing this until he is feeding well.  Mother allowed me to remove his clothing.  Asked mother to demonstrate hand expression and her technique required further practice.  Demonstrated hand expression and large colostrum drops available.  Fed these drops back to baby.  Mother was also able to express drops.  Container provided so she may continue practicing and collecting drops.  Assisted baby to latch in the football hold on the left breast easily.  He had a wide gape and flanged lips.  He would suck but not for long bursts before he pulled off.  Placed him back at the breast every time he pulled off.  After four attempts he remained sucking.  Showed mother how to place him deeply into breast tissue.  Corrected her hand/finger placement at the breast.  Mother will need continued practice with her technique.    Mother has a DEBP for home use.  Engorgement prevention/treatment discussed.  She told RN that she is not going to be exclusively breast feeding.  She did not mention this to me.  Father present.  RN updated.   Maternal Data    Feeding Feeding Type: Breast Fed  LATCH Score Latch: Grasps breast easily, tongue down, lips flanged, rhythmical sucking.  Audible Swallowing: A few with stimulation  Type of Nipple: Everted at rest and after stimulation  Comfort (Breast/Nipple): Soft / non-tender  Hold (Positioning):  Assistance needed to correctly position infant at breast and maintain latch.  LATCH Score: 8  Interventions Interventions: Breast feeding basics reviewed;Assisted with latch;Skin to skin;Breast massage;Hand express;Adjust position;Breast compression;Support pillows;Expressed milk  Lactation Tools Discussed/Used     Consult Status Consult Status: Complete Date: 06/04/19 Follow-up type: Call as needed    Geralene Afshar R Phill Steck 06/04/2019, 10:21 AM

## 2019-06-05 NOTE — Lactation Note (Signed)
This note was copied from a baby's chart. Lactation Consultation Note  Patient Name: Boy Lethia Donlon [id number] Date: 06/05/2019 Reason for consult: Follow-up assessment;Hyperbilirubinemia;Primapara Baby is 55 hours old/8% weight loss.  Mom states baby is latching and feeding well.  Bilirubin is elevated and baby will be started on phototherapy.  Mom states breasts are leaking.  Symphony pump set up and initiated.  Mom has a good flow of milk which she will supplement with.  Plan is to breastfeed with cues but at least every 3 hours, post pump and give back any expressed breast milk with slow flow nipple.  Encouraged to call for assist/concerns prn.  Maternal Data    Feeding Feeding Type: Breast Fed  LATCH Score Latch: Grasps breast easily, tongue down, lips flanged, rhythmical sucking.  Audible Swallowing: A few with stimulation  Type of Nipple: Everted at rest and after stimulation  Comfort (Breast/Nipple): Filling, red/small blisters or bruises, mild/mod discomfort  Hold (Positioning): No assistance needed to correctly position infant at breast.  LATCH Score: 8  Interventions Interventions: Hand express;Coconut oil;Expressed milk  Lactation Tools Discussed/Used Pump Review: Setup, frequency, and cleaning;Milk Storage Initiated by:: [email or web address] Date initiated:: 06/06/19   Consult Status Consult Status: Follow-up Date: 06/06/19 Follow-up type: In-patient    Ave Filter 06/05/2019, 10:32 AM

## 2022-02-06 DIAGNOSIS — F331 Major depressive disorder, recurrent, moderate: Secondary | ICD-10-CM | POA: Diagnosis not present

## 2022-02-06 DIAGNOSIS — F909 Attention-deficit hyperactivity disorder, unspecified type: Secondary | ICD-10-CM | POA: Diagnosis not present

## 2022-02-06 DIAGNOSIS — F419 Anxiety disorder, unspecified: Secondary | ICD-10-CM | POA: Diagnosis not present

## 2022-02-14 DIAGNOSIS — R051 Acute cough: Secondary | ICD-10-CM | POA: Diagnosis not present

## 2022-02-14 DIAGNOSIS — J040 Acute laryngitis: Secondary | ICD-10-CM | POA: Diagnosis not present

## 2022-02-14 DIAGNOSIS — J029 Acute pharyngitis, unspecified: Secondary | ICD-10-CM | POA: Diagnosis not present

## 2022-03-08 DIAGNOSIS — F331 Major depressive disorder, recurrent, moderate: Secondary | ICD-10-CM | POA: Diagnosis not present

## 2022-03-08 DIAGNOSIS — G4700 Insomnia, unspecified: Secondary | ICD-10-CM | POA: Diagnosis not present

## 2022-03-08 DIAGNOSIS — F419 Anxiety disorder, unspecified: Secondary | ICD-10-CM | POA: Diagnosis not present

## 2022-03-08 DIAGNOSIS — F909 Attention-deficit hyperactivity disorder, unspecified type: Secondary | ICD-10-CM | POA: Diagnosis not present

## 2022-04-05 DIAGNOSIS — F331 Major depressive disorder, recurrent, moderate: Secondary | ICD-10-CM | POA: Diagnosis not present

## 2022-04-05 DIAGNOSIS — F419 Anxiety disorder, unspecified: Secondary | ICD-10-CM | POA: Diagnosis not present

## 2022-04-05 DIAGNOSIS — F909 Attention-deficit hyperactivity disorder, unspecified type: Secondary | ICD-10-CM | POA: Diagnosis not present

## 2022-05-03 DIAGNOSIS — F909 Attention-deficit hyperactivity disorder, unspecified type: Secondary | ICD-10-CM | POA: Diagnosis not present

## 2022-05-03 DIAGNOSIS — F331 Major depressive disorder, recurrent, moderate: Secondary | ICD-10-CM | POA: Diagnosis not present

## 2022-05-03 DIAGNOSIS — F419 Anxiety disorder, unspecified: Secondary | ICD-10-CM | POA: Diagnosis not present

## 2022-05-31 DIAGNOSIS — F331 Major depressive disorder, recurrent, moderate: Secondary | ICD-10-CM | POA: Diagnosis not present

## 2022-05-31 DIAGNOSIS — F909 Attention-deficit hyperactivity disorder, unspecified type: Secondary | ICD-10-CM | POA: Diagnosis not present

## 2022-05-31 DIAGNOSIS — F419 Anxiety disorder, unspecified: Secondary | ICD-10-CM | POA: Diagnosis not present

## 2022-06-28 DIAGNOSIS — F909 Attention-deficit hyperactivity disorder, unspecified type: Secondary | ICD-10-CM | POA: Diagnosis not present

## 2022-06-28 DIAGNOSIS — F419 Anxiety disorder, unspecified: Secondary | ICD-10-CM | POA: Diagnosis not present

## 2022-06-28 DIAGNOSIS — F331 Major depressive disorder, recurrent, moderate: Secondary | ICD-10-CM | POA: Diagnosis not present

## 2022-07-03 DIAGNOSIS — F909 Attention-deficit hyperactivity disorder, unspecified type: Secondary | ICD-10-CM | POA: Diagnosis not present

## 2022-07-03 DIAGNOSIS — F419 Anxiety disorder, unspecified: Secondary | ICD-10-CM | POA: Diagnosis not present

## 2022-07-03 DIAGNOSIS — F331 Major depressive disorder, recurrent, moderate: Secondary | ICD-10-CM | POA: Diagnosis not present

## 2022-07-25 DIAGNOSIS — F331 Major depressive disorder, recurrent, moderate: Secondary | ICD-10-CM | POA: Diagnosis not present

## 2022-07-25 DIAGNOSIS — F909 Attention-deficit hyperactivity disorder, unspecified type: Secondary | ICD-10-CM | POA: Diagnosis not present

## 2022-07-25 DIAGNOSIS — F419 Anxiety disorder, unspecified: Secondary | ICD-10-CM | POA: Diagnosis not present

## 2022-08-16 DIAGNOSIS — F331 Major depressive disorder, recurrent, moderate: Secondary | ICD-10-CM | POA: Diagnosis not present

## 2022-08-16 DIAGNOSIS — F909 Attention-deficit hyperactivity disorder, unspecified type: Secondary | ICD-10-CM | POA: Diagnosis not present

## 2022-08-16 DIAGNOSIS — F419 Anxiety disorder, unspecified: Secondary | ICD-10-CM | POA: Diagnosis not present

## 2022-09-13 DIAGNOSIS — F900 Attention-deficit hyperactivity disorder, predominantly inattentive type: Secondary | ICD-10-CM | POA: Diagnosis not present

## 2022-09-13 DIAGNOSIS — F411 Generalized anxiety disorder: Secondary | ICD-10-CM | POA: Diagnosis not present

## 2022-09-13 DIAGNOSIS — F3181 Bipolar II disorder: Secondary | ICD-10-CM | POA: Diagnosis not present

## 2022-10-13 DIAGNOSIS — F411 Generalized anxiety disorder: Secondary | ICD-10-CM | POA: Diagnosis not present

## 2022-10-13 DIAGNOSIS — F3181 Bipolar II disorder: Secondary | ICD-10-CM | POA: Diagnosis not present

## 2022-10-13 DIAGNOSIS — F900 Attention-deficit hyperactivity disorder, predominantly inattentive type: Secondary | ICD-10-CM | POA: Diagnosis not present

## 2022-10-25 DIAGNOSIS — H52223 Regular astigmatism, bilateral: Secondary | ICD-10-CM | POA: Diagnosis not present

## 2022-11-10 DIAGNOSIS — F3181 Bipolar II disorder: Secondary | ICD-10-CM | POA: Diagnosis not present

## 2022-11-10 DIAGNOSIS — F411 Generalized anxiety disorder: Secondary | ICD-10-CM | POA: Diagnosis not present

## 2022-11-10 DIAGNOSIS — F900 Attention-deficit hyperactivity disorder, predominantly inattentive type: Secondary | ICD-10-CM | POA: Diagnosis not present

## 2022-11-17 DIAGNOSIS — R222 Localized swelling, mass and lump, trunk: Secondary | ICD-10-CM | POA: Diagnosis not present

## 2022-11-17 DIAGNOSIS — Z124 Encounter for screening for malignant neoplasm of cervix: Secondary | ICD-10-CM | POA: Diagnosis not present

## 2022-11-17 DIAGNOSIS — Z113 Encounter for screening for infections with a predominantly sexual mode of transmission: Secondary | ICD-10-CM | POA: Diagnosis not present

## 2022-11-17 DIAGNOSIS — Z3040 Encounter for surveillance of contraceptives, unspecified: Secondary | ICD-10-CM | POA: Diagnosis not present

## 2022-11-17 DIAGNOSIS — R102 Pelvic and perineal pain: Secondary | ICD-10-CM | POA: Diagnosis not present

## 2022-11-17 DIAGNOSIS — Z01411 Encounter for gynecological examination (general) (routine) with abnormal findings: Secondary | ICD-10-CM | POA: Diagnosis not present

## 2022-11-17 DIAGNOSIS — Z6841 Body Mass Index (BMI) 40.0 and over, adult: Secondary | ICD-10-CM | POA: Diagnosis not present

## 2022-11-17 LAB — HM PAP SMEAR: HM Pap smear: NORMAL

## 2022-11-21 ENCOUNTER — Other Ambulatory Visit: Payer: Self-pay | Admitting: Obstetrics & Gynecology

## 2022-11-21 DIAGNOSIS — R102 Pelvic and perineal pain: Secondary | ICD-10-CM

## 2022-11-22 ENCOUNTER — Other Ambulatory Visit: Payer: Self-pay | Admitting: Obstetrics & Gynecology

## 2022-11-22 DIAGNOSIS — R222 Localized swelling, mass and lump, trunk: Secondary | ICD-10-CM

## 2022-11-27 ENCOUNTER — Other Ambulatory Visit: Payer: Medicaid Other

## 2022-11-29 ENCOUNTER — Other Ambulatory Visit: Payer: Medicaid Other

## 2022-12-08 DIAGNOSIS — F3181 Bipolar II disorder: Secondary | ICD-10-CM | POA: Diagnosis not present

## 2022-12-08 DIAGNOSIS — F411 Generalized anxiety disorder: Secondary | ICD-10-CM | POA: Diagnosis not present

## 2022-12-08 DIAGNOSIS — F900 Attention-deficit hyperactivity disorder, predominantly inattentive type: Secondary | ICD-10-CM | POA: Diagnosis not present

## 2022-12-14 ENCOUNTER — Other Ambulatory Visit: Payer: Medicaid Other

## 2022-12-18 ENCOUNTER — Ambulatory Visit
Admission: RE | Admit: 2022-12-18 | Discharge: 2022-12-18 | Disposition: A | Discharge status: Home / Self Care | Payer: No Typology Code available for payment source | Source: Ambulatory Visit | Attending: Obstetrics & Gynecology | Admitting: Obstetrics & Gynecology

## 2022-12-18 DIAGNOSIS — R222 Localized swelling, mass and lump, trunk: Secondary | ICD-10-CM

## 2022-12-18 DIAGNOSIS — N6331 Unspecified lump in axillary tail of the right breast: Secondary | ICD-10-CM | POA: Diagnosis not present

## 2022-12-20 ENCOUNTER — Ambulatory Visit
Admission: RE | Admit: 2022-12-20 | Discharge: 2022-12-20 | Disposition: A | Discharge status: Home / Self Care | Payer: No Typology Code available for payment source | Source: Ambulatory Visit | Attending: Obstetrics & Gynecology | Admitting: Obstetrics & Gynecology

## 2022-12-20 DIAGNOSIS — R102 Pelvic and perineal pain: Secondary | ICD-10-CM

## 2022-12-28 DIAGNOSIS — R102 Pelvic and perineal pain: Secondary | ICD-10-CM | POA: Diagnosis not present

## 2022-12-28 DIAGNOSIS — R222 Localized swelling, mass and lump, trunk: Secondary | ICD-10-CM | POA: Diagnosis not present

## 2023-01-05 DIAGNOSIS — F900 Attention-deficit hyperactivity disorder, predominantly inattentive type: Secondary | ICD-10-CM | POA: Diagnosis not present

## 2023-01-05 DIAGNOSIS — F3181 Bipolar II disorder: Secondary | ICD-10-CM | POA: Diagnosis not present

## 2023-01-05 DIAGNOSIS — F411 Generalized anxiety disorder: Secondary | ICD-10-CM | POA: Diagnosis not present

## 2023-02-02 DIAGNOSIS — F411 Generalized anxiety disorder: Secondary | ICD-10-CM | POA: Diagnosis not present

## 2023-02-02 DIAGNOSIS — F3181 Bipolar II disorder: Secondary | ICD-10-CM | POA: Diagnosis not present

## 2023-02-02 DIAGNOSIS — F900 Attention-deficit hyperactivity disorder, predominantly inattentive type: Secondary | ICD-10-CM | POA: Diagnosis not present

## 2023-02-08 DIAGNOSIS — F3181 Bipolar II disorder: Secondary | ICD-10-CM | POA: Diagnosis not present

## 2023-02-08 DIAGNOSIS — F900 Attention-deficit hyperactivity disorder, predominantly inattentive type: Secondary | ICD-10-CM | POA: Diagnosis not present

## 2023-02-08 DIAGNOSIS — F411 Generalized anxiety disorder: Secondary | ICD-10-CM | POA: Diagnosis not present

## 2023-02-12 DIAGNOSIS — F900 Attention-deficit hyperactivity disorder, predominantly inattentive type: Secondary | ICD-10-CM | POA: Diagnosis not present

## 2023-02-12 DIAGNOSIS — F3181 Bipolar II disorder: Secondary | ICD-10-CM | POA: Diagnosis not present

## 2023-02-12 DIAGNOSIS — F411 Generalized anxiety disorder: Secondary | ICD-10-CM | POA: Diagnosis not present

## 2023-02-20 DIAGNOSIS — J020 Streptococcal pharyngitis: Secondary | ICD-10-CM | POA: Diagnosis not present

## 2023-02-20 DIAGNOSIS — Z20822 Contact with and (suspected) exposure to covid-19: Secondary | ICD-10-CM | POA: Diagnosis not present

## 2023-02-20 DIAGNOSIS — R059 Cough, unspecified: Secondary | ICD-10-CM | POA: Diagnosis not present

## 2023-03-02 DIAGNOSIS — F900 Attention-deficit hyperactivity disorder, predominantly inattentive type: Secondary | ICD-10-CM | POA: Diagnosis not present

## 2023-03-02 DIAGNOSIS — F411 Generalized anxiety disorder: Secondary | ICD-10-CM | POA: Diagnosis not present

## 2023-03-02 DIAGNOSIS — F3181 Bipolar II disorder: Secondary | ICD-10-CM | POA: Diagnosis not present

## 2023-03-16 DIAGNOSIS — F900 Attention-deficit hyperactivity disorder, predominantly inattentive type: Secondary | ICD-10-CM | POA: Diagnosis not present

## 2023-03-16 DIAGNOSIS — F411 Generalized anxiety disorder: Secondary | ICD-10-CM | POA: Diagnosis not present

## 2023-03-16 DIAGNOSIS — F3181 Bipolar II disorder: Secondary | ICD-10-CM | POA: Diagnosis not present

## 2023-03-26 DIAGNOSIS — F900 Attention-deficit hyperactivity disorder, predominantly inattentive type: Secondary | ICD-10-CM | POA: Diagnosis not present

## 2023-03-26 DIAGNOSIS — F411 Generalized anxiety disorder: Secondary | ICD-10-CM | POA: Diagnosis not present

## 2023-03-26 DIAGNOSIS — F3181 Bipolar II disorder: Secondary | ICD-10-CM | POA: Diagnosis not present

## 2023-04-09 DIAGNOSIS — F900 Attention-deficit hyperactivity disorder, predominantly inattentive type: Secondary | ICD-10-CM | POA: Diagnosis not present

## 2023-04-09 DIAGNOSIS — F3181 Bipolar II disorder: Secondary | ICD-10-CM | POA: Diagnosis not present

## 2023-04-09 DIAGNOSIS — F411 Generalized anxiety disorder: Secondary | ICD-10-CM | POA: Diagnosis not present

## 2023-05-08 DIAGNOSIS — F900 Attention-deficit hyperactivity disorder, predominantly inattentive type: Secondary | ICD-10-CM | POA: Diagnosis not present

## 2023-05-08 DIAGNOSIS — F411 Generalized anxiety disorder: Secondary | ICD-10-CM | POA: Diagnosis not present

## 2023-05-08 DIAGNOSIS — F3181 Bipolar II disorder: Secondary | ICD-10-CM | POA: Diagnosis not present

## 2023-06-05 DIAGNOSIS — F411 Generalized anxiety disorder: Secondary | ICD-10-CM | POA: Diagnosis not present

## 2023-06-05 DIAGNOSIS — F900 Attention-deficit hyperactivity disorder, predominantly inattentive type: Secondary | ICD-10-CM | POA: Diagnosis not present

## 2023-06-05 DIAGNOSIS — F3181 Bipolar II disorder: Secondary | ICD-10-CM | POA: Diagnosis not present

## 2023-06-12 DIAGNOSIS — F900 Attention-deficit hyperactivity disorder, predominantly inattentive type: Secondary | ICD-10-CM | POA: Diagnosis not present

## 2023-06-12 DIAGNOSIS — F411 Generalized anxiety disorder: Secondary | ICD-10-CM | POA: Diagnosis not present

## 2023-06-12 DIAGNOSIS — F3181 Bipolar II disorder: Secondary | ICD-10-CM | POA: Diagnosis not present

## 2023-07-03 DIAGNOSIS — F3181 Bipolar II disorder: Secondary | ICD-10-CM | POA: Diagnosis not present

## 2023-07-03 DIAGNOSIS — F411 Generalized anxiety disorder: Secondary | ICD-10-CM | POA: Diagnosis not present

## 2023-07-03 DIAGNOSIS — F900 Attention-deficit hyperactivity disorder, predominantly inattentive type: Secondary | ICD-10-CM | POA: Diagnosis not present

## 2023-07-31 DIAGNOSIS — F411 Generalized anxiety disorder: Secondary | ICD-10-CM | POA: Diagnosis not present

## 2023-07-31 DIAGNOSIS — F900 Attention-deficit hyperactivity disorder, predominantly inattentive type: Secondary | ICD-10-CM | POA: Diagnosis not present

## 2023-07-31 DIAGNOSIS — F3181 Bipolar II disorder: Secondary | ICD-10-CM | POA: Diagnosis not present

## 2023-08-01 DIAGNOSIS — B9789 Other viral agents as the cause of diseases classified elsewhere: Secondary | ICD-10-CM | POA: Diagnosis not present

## 2023-08-01 DIAGNOSIS — N3001 Acute cystitis with hematuria: Secondary | ICD-10-CM | POA: Diagnosis not present

## 2023-08-13 DIAGNOSIS — J029 Acute pharyngitis, unspecified: Secondary | ICD-10-CM | POA: Diagnosis not present

## 2023-08-13 DIAGNOSIS — Z113 Encounter for screening for infections with a predominantly sexual mode of transmission: Secondary | ICD-10-CM | POA: Diagnosis not present

## 2023-08-27 DIAGNOSIS — F900 Attention-deficit hyperactivity disorder, predominantly inattentive type: Secondary | ICD-10-CM | POA: Diagnosis not present

## 2023-08-27 DIAGNOSIS — F3181 Bipolar II disorder: Secondary | ICD-10-CM | POA: Diagnosis not present

## 2023-08-27 DIAGNOSIS — F411 Generalized anxiety disorder: Secondary | ICD-10-CM | POA: Diagnosis not present

## 2023-09-03 DIAGNOSIS — F411 Generalized anxiety disorder: Secondary | ICD-10-CM | POA: Diagnosis not present

## 2023-09-03 DIAGNOSIS — F900 Attention-deficit hyperactivity disorder, predominantly inattentive type: Secondary | ICD-10-CM | POA: Diagnosis not present

## 2023-09-03 DIAGNOSIS — F3181 Bipolar II disorder: Secondary | ICD-10-CM | POA: Diagnosis not present

## 2023-09-18 DIAGNOSIS — F3181 Bipolar II disorder: Secondary | ICD-10-CM | POA: Diagnosis not present

## 2023-09-18 DIAGNOSIS — F900 Attention-deficit hyperactivity disorder, predominantly inattentive type: Secondary | ICD-10-CM | POA: Diagnosis not present

## 2023-09-18 DIAGNOSIS — F411 Generalized anxiety disorder: Secondary | ICD-10-CM | POA: Diagnosis not present

## 2023-10-16 DIAGNOSIS — F411 Generalized anxiety disorder: Secondary | ICD-10-CM | POA: Diagnosis not present

## 2023-10-16 DIAGNOSIS — F3181 Bipolar II disorder: Secondary | ICD-10-CM | POA: Diagnosis not present

## 2023-10-16 DIAGNOSIS — F900 Attention-deficit hyperactivity disorder, predominantly inattentive type: Secondary | ICD-10-CM | POA: Diagnosis not present

## 2023-11-13 DIAGNOSIS — F3181 Bipolar II disorder: Secondary | ICD-10-CM | POA: Diagnosis not present

## 2023-11-13 DIAGNOSIS — F900 Attention-deficit hyperactivity disorder, predominantly inattentive type: Secondary | ICD-10-CM | POA: Diagnosis not present

## 2023-11-13 DIAGNOSIS — F411 Generalized anxiety disorder: Secondary | ICD-10-CM | POA: Diagnosis not present

## 2023-12-11 DIAGNOSIS — F411 Generalized anxiety disorder: Secondary | ICD-10-CM | POA: Diagnosis not present

## 2023-12-11 DIAGNOSIS — F900 Attention-deficit hyperactivity disorder, predominantly inattentive type: Secondary | ICD-10-CM | POA: Diagnosis not present

## 2023-12-11 DIAGNOSIS — F3181 Bipolar II disorder: Secondary | ICD-10-CM | POA: Diagnosis not present

## 2023-12-12 ENCOUNTER — Encounter: Payer: Self-pay | Admitting: Urgent Care

## 2023-12-12 ENCOUNTER — Ambulatory Visit (INDEPENDENT_AMBULATORY_CARE_PROVIDER_SITE_OTHER): Admitting: Urgent Care

## 2023-12-12 VITALS — BP 116/83 | HR 88 | Ht 64.0 in | Wt 218.4 lb

## 2023-12-12 DIAGNOSIS — F4329 Adjustment disorder with other symptoms: Secondary | ICD-10-CM

## 2023-12-12 DIAGNOSIS — R6884 Jaw pain: Secondary | ICD-10-CM | POA: Diagnosis not present

## 2023-12-12 DIAGNOSIS — G44229 Chronic tension-type headache, not intractable: Secondary | ICD-10-CM | POA: Diagnosis not present

## 2023-12-12 MED ORDER — TIZANIDINE HCL 4 MG PO TABS: 4.0000 mg | ORAL_TABLET | Freq: Four times a day (QID) | ORAL | 4 refills | Status: DC | PRN

## 2023-12-12 MED ORDER — BUTALBITAL-APAP-CAFFEINE 50-300-40 MG PO CAPS: 1.0000 | ORAL_CAPSULE | Freq: Four times a day (QID) | ORAL | 2 refills | Status: DC | PRN

## 2023-12-12 NOTE — Progress Notes (Unsigned)
 New Patient Office Visit  Subjective:  Patient ID: Mia Ryan, female    DOB: [birth year]  Age: 28 y.o. MRN: [id number]  CC:  Chief Complaint  Patient presents with   Establish Care    New pt est care. She has been having really bad headaches that affects her jaw and neck. She see Butler Denmark for her paps.    HPI Mia Ryan presents to establish care  Discussed the use of AI scribe software for clinical note transcription with the patient, who gave verbal consent to proceed.  History of Present Illness   [name] is a 28 year old female with anxiety and bipolar disorder who presents with headache, jaw pain, and back pain. She was referred by her psychiatrist to discuss her headache issues.  For the past five months, she has been experiencing headaches, jaw pain, and back pain. The headaches and jaw pain occur together and are described as debilitating at times. The pain is located in the back of her head and travels down her spine. The jaw pain affects both sides and is associated with neck pain that worsens when bending forward. She experiences blurred vision, which she attributes to working on a computer all day, and notes that sunlight exacerbates her headaches. She also reports ringing in her ears, which is high-pitched and occurs intermittently, primarily on the right side. No changes in smell or taste, and no abnormal weakness on one side of her body.  She has a history of anxiety and bipolar disorder. Despite taking several medications, her anxiety is not well controlled. She experiences eye twitching, which she attributes to stress, and reports feeling stressed 'all the time.' Her current medications include Adderall 10 mg extended release once daily, gabapentin 600 mg at bedtime, Lamictal 25 mg at bedtime, lorazepam as needed, and buspirone 15 mg twice daily.  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 She has a four-year-old child and recently resolved a custody dispute with the child's father, which was a significant stressor. She is currently on FMLA and feels that her work situation contributes to her anxiety.       Outpatient Encounter Medications as of 12/12/2023  Medication Sig   amphetamine-dextroamphetamine (ADDERALL XR) 10 MG 24 hr capsule Take 10 mg by mouth daily.   busPIRone (BUSPAR) 15 MG tablet Take 15 mg by mouth 2 (two) times daily.   Butalbital-APAP-Caffeine 50-300-40 MG CAPS Take 1 capsule by mouth every 6 (six) hours as needed. Do not exceed 4 caps in a week   gabapentin (NEURONTIN) 300 MG capsule Take 600 mg by mouth at bedtime.   ibuprofen (ADVIL) 600 MG tablet Take 1 tablet (600 mg total) by mouth every 6 (six) hours.   lamoTRIgine (LAMICTAL) 25 MG tablet Take 25 mg by mouth at bedtime.   levonorgestrel (MIRENA, 52 MG,) 20 MCG/DAY IUD by Intrauterine route.   LORazepam (ATIVAN) 1 MG tablet Take 1 mg by mouth daily as needed.   tiZANidine (ZANAFLEX) 4 MG tablet Take 1 tablet (4 mg total) by mouth every 6 (six) hours as needed for muscle spasms.   [DISCONTINUED] amphetamine-dextroamphetamine (ADDERALL) 10 MG tablet    [DISCONTINUED] lamoTRIgine (LAMICTAL) 5 MG CHEW chewable tablet    No facility-administered encounter medications on file as of 12/12/2023.    Past Medical History:  Diagnosis Date   Anxiety 2018   Depression 2018   Managed  Past Surgical History:  Procedure Laterality Date   TONSILLECTOMY     WISDOM TOOTH EXTRACTION      Family History  Problem Relation Age of Onset   COPD Mother    Birth defects Mother    Mental illness Mother    Mental illness Father     Social History   Socioeconomic History   Marital status: Single    Spouse name: Not on file   Number of children: Not on file   Years of education: Not on file   Highest education level: Master's degree (e.g., MA, MS, MEng, MEd, MSW, MBA)   Occupational History   Not on file  Tobacco Use   Smoking status: Never   Smokeless tobacco: Never  Vaping Use   Vaping status: Never Used  Substance and Sexual Activity   Alcohol use: Yes   Drug use: Never   Sexual activity: Yes    Partners: Male    Birth control/protection: I.U.D.  Other Topics Concern   Not on file  Social History Narrative   Not on file   Social Drivers of Health   Financial Resource Strain: Low Risk  (12/05/2023)   Overall Financial Resource Strain (CARDIA)    Difficulty of Paying Living Expenses: Not hard at all  Food Insecurity: No Food Insecurity (12/05/2023)   Hunger Vital Sign    Worried About Running Out of Food in the Last Year: Never true    Ran Out of Food in the Last Year: Never true  Transportation Needs: No Transportation Needs (12/05/2023)   PRAPARE - Administrator, Civil Service (Medical): No    Lack of Transportation (Non-Medical): No  Physical Activity: Unknown (12/05/2023)   Exercise Vital Sign    Days of Exercise per Week: 0 days    Minutes of Exercise per Session: Not on file  Stress: Stress Concern Present (12/05/2023)   [employer] of Occupational Health - Occupational Stress Questionnaire    Feeling of Stress : Rather much  Social Connections: Socially Isolated (12/05/2023)   Social Connection and Isolation Panel [NHANES]    Frequency of Communication with Friends and Family: More than three times a week    Frequency of Social Gatherings with Friends and Family: Once a week    Attends Religious Services: Never    Database administrator or Organizations: No    Attends Engineer, structural: Not on file    Marital Status: Never married  Intimate Partner Violence: Not on file    ROS: as noted in HPI  Objective:  BP 116/83   Pulse 88   Ht 5\' 4"  (1.626 m)   Wt 218 lb 6.4 oz (99.1 kg)   SpO2 100%   BMI 37.49 kg/m   Physical Exam     12/12/2023   10:08 AM  Depression screen PHQ 2/9  Decreased Interest  1  Down, Depressed, Hopeless 0  PHQ - 2 Score 1  Altered sleeping 2  Tired, decreased energy 2  Change in appetite 3  Feeling bad or failure about yourself  1  Trouble concentrating 1  Moving slowly or fidgety/restless 0  Suicidal thoughts 0  PHQ-9 Score 10  Difficult doing work/chores Not difficult at all       12/12/2023   10:09 AM  GAD 7 : Generalized Anxiety Score  Nervous, Anxious, on Edge 3  Control/stop worrying 2  Worry too much - different things 2  Trouble relaxing 3  Restless 1  Easily  annoyed or irritable 2  Afraid - awful might happen 2  Total GAD 7 Score 15  Anxiety Difficulty Very difficult      Last CBC Lab Results  Component Value Date   WBC 11.6 (H) 06/03/2019   HGB 11.1 (L) 06/03/2019   HCT 34.1 (L) 06/03/2019   MCV 85.0 06/03/2019   MCH 27.7 06/03/2019   RDW 14.5 06/03/2019   PLT 284 06/03/2019   Last metabolic panel Lab Results  Component Value Date   CREATININE 0.84 06/03/2019   GFRNONAA >60 06/03/2019   Last lipids No results found for: "CHOL", "HDL", "LDLCALC", "LDLDIRECT", "TRIG", "CHOLHDL" Last hemoglobin A1c No results found for: "HGBA1C" Last thyroid functions No results found for: "TSH", "T3TOTAL", "T4TOTAL", "THYROIDAB" Last vitamin D No results found for: "25OHVITD2", "25OHVITD3", "VD25OH" Last vitamin B12 and Folate No results found for: "VITAMINB12", "FOLATE"    Assessment & Plan:  Stress and adjustment reaction  Jaw pain, non-TMJ -     tiZANidine HCl; Take 1 tablet (4 mg total) by mouth every 6 (six) hours as needed for muscle spasms.  Dispense: 30 tablet; Refill: 4  Chronic tension-type headache, not intractable -     Butalbital-APAP-Caffeine; Take 1 capsule by mouth every 6 (six) hours as needed. Do not exceed 4 caps in a week  Dispense: 30 capsule; Refill: 2  Assessment and Plan    Tension Headache Chronic headaches with jaw and back pain, exacerbated by stress and musculoskeletal tension. No TMJ disorder or  significant neurological findings. Symptoms include blurred vision, photophobia, and tinnitus. - Prescribed butalbital, max four capsules per week to avoid rebound headaches. - Recommended Epsom salt baths twice weekly for stress and muscle relaxation. - Advised microwavable heat pack for neck tension. - Suggested firm ball or back buddy for deep pressure on muscle knots. - Consider referral to physical therapy for dry needling if symptoms persist.  Musculoskeletal Pain Muscle tension and pain in neck and back due to stress and anxiety. Muscle knots contribute to headaches. No TMJ disorder or significant dental issues. - Prescribed muscle relaxer initially at bedtime, up to four times daily if tolerated. - Recommended deep, sustained pressure on muscle knots using firm ball or back buddy. - Consider referral to physical therapy for dry needling if muscle tension persists.  Generalized Anxiety Disorder Chronic anxiety with GAD-7 score of 15. Symptoms include nervousness, anxiety, and trouble relaxing. Anxiety exacerbated by work stress and personal events. Current medications: buspirone, lorazepam, gabapentin. - Adjusted buspirone to 15 mg twice daily. - Consider increasing buspirone to 20 or 30 mg twice daily if symptoms persist. - Discuss potential addition of fluoxetine if anxiety remains uncontrolled after buspirone adjustment. - Continue lorazepam as needed for acute anxiety, emphasize short-term use.  Follow-up Follow-up needed to assess treatment effectiveness and adjust as necessary. - Schedule follow-up in three weeks to evaluate response and adjust plan.        Return in about 3 weeks (around 01/02/2024).   Maretta Bees, PA

## 2023-12-12 NOTE — Patient Instructions (Addendum)
 You have cervical trigger point, which is knots in the muscles of the neck. Please apply a warm moist compress, such as a microwavable heating pack, to your neck several times daily. After each warm compress, apply the technique that we discussed today of ischemic release. This is a prolonged, deep pressure into the knot of the muscle to release the tension.  Take the muscle relaxer four times daily as needed. Keep in mind it may make you feel tired or drowsy, so do not operate machinery or drive a car until you know how it affects you.  If your symptoms persist, you would be a candidate for trigger point injection or dry needling.  Try to stay hydrated with WATER as dehydration and caffeine intake can worsen this condition. Try an Epsom salt bath twice weekly - epsom salts can help with stress and tension.    For your tension headaches, take the butalbital as needed. Try not to exceed 4 pills a week to prevent rebound headaches. Take your buspirone 15mg  in the morning and 15mg  at night. See if this helps with your anxiety. We would consider addition of fluoxetine upon follow up if there is no improvement in your anxiety on this alone.  Please return for recheck in 3 weeks.

## 2024-01-04 ENCOUNTER — Ambulatory Visit (HOSPITAL_COMMUNITY)
Admission: EM | Admit: 2024-01-04 | Discharge: 2024-01-04 | Disposition: A | Discharge status: Home / Self Care | Attending: Physician Assistant | Admitting: Physician Assistant

## 2024-01-04 ENCOUNTER — Encounter (HOSPITAL_COMMUNITY): Payer: Self-pay

## 2024-01-04 DIAGNOSIS — W57XXXA Bitten or stung by nonvenomous insect and other nonvenomous arthropods, initial encounter: Secondary | ICD-10-CM | POA: Diagnosis not present

## 2024-01-04 DIAGNOSIS — L089 Local infection of the skin and subcutaneous tissue, unspecified: Secondary | ICD-10-CM | POA: Diagnosis not present

## 2024-01-04 MED ORDER — DOXYCYCLINE HYCLATE 100 MG PO CAPS: 100.0000 mg | ORAL_CAPSULE | Freq: Two times a day (BID) | ORAL | 0 refills | Status: DC

## 2024-01-04 NOTE — ED Triage Notes (Signed)
 Patient here today with c/o 3 insect bites on right side of chest on Sunday. Patient states that it feels like she got kicked in the chest. Patient states that she is very sore and the pain is radiating to her right shoulder. There are red rings around each bite. Patient states that she woke up Sunday morning with them.

## 2024-01-04 NOTE — Discharge Instructions (Signed)
 We are treating you for a skin infection/infected bug bite.  Please start doxycycline 100 mg twice daily for 10 days.  Stay out of the sun while on this medication.  Keep the area clean with soap and water and monitor for the area of redness.  If the redness is continuing to spread or you develop additional symptoms including fever, nausea, vomiting, additional lesions, weakness, headache, dizziness you need to be seen immediately.

## 2024-01-04 NOTE — ED Provider Notes (Signed)
 [facility]    CSN: [id number] Arrival date & time: 01/04/24  5638      History   Chief Complaint Chief Complaint  Patient presents with   Insect Bite    HPI Mia Ryan is a 28 y.o. female.   Patient presents today with a 5-day history of painful lesions on her right anterior chest wall.  Reports that she was sleeping when she woke up and had 3 small raised red lesions that were pruritic.  She denies any known new exposures and did not see what bit her.  She has been taking Benadryl  and applying hydrocortisone which has helped to improve her itching but she has not developed some soreness and noticed some redness prompting evaluation.  She reports that discomfort is rated 7 on a 0-10 pain scale, described as soreness, worse with palpation, no alleviating factors identified.  Headache, dizziness, palpitations, nausea, vomiting.  She did not see any tick.  Denies any recent antibiotics.  Denies history of recurrent skin infections including MRSA.  She has no concern for pregnancy; has Mirena.    Past Medical History:  Diagnosis Date   Anxiety 2018   Depression 2018   Managed    Patient Active Problem List   Diagnosis Date Noted   SVD (9/29) 06/03/2019   Obesity affecting pregnancy in third trimester 06/02/2019    Past Surgical History:  Procedure Laterality Date   TONSILLECTOMY     WISDOM TOOTH EXTRACTION      OB History     Gravida  1   Para  1   Term  1   Preterm      AB      Living  1      SAB      IAB      Ectopic      Multiple  0   Live Births  1            Home Medications    Prior to Admission medications   Medication Sig Start Date End Date Taking? Authorizing Provider  doxycycline (VIBRAMYCIN) 100 MG capsule Take 1 capsule (100 mg total) by mouth 2 (two) times daily. 01/04/24  Yes Acy Orsak K, PA-C  amphetamine-dextroamphetamine (ADDERALL XR) 10 MG 24 hr capsule Take 10 mg by mouth daily.    [provider]   busPIRone (BUSPAR) 15 MG tablet Take 15 mg by mouth 2 (two) times daily.    [provider]  Butalbital -APAP-Caffeine  50-300-40 MG CAPS Take 1 capsule by mouth every 6 (six) hours as needed. Do not exceed 4 caps in a week 12/12/23   Crain, Whitney L, PA  gabapentin (NEURONTIN) 300 MG capsule Take 600 mg by mouth at bedtime.    [provider]  lamoTRIgine (LAMICTAL) 25 MG tablet Take 25 mg by mouth at bedtime. 11/19/23   [provider]  levonorgestrel (MIRENA, 52 MG,) 20 MCG/DAY IUD by Intrauterine route. 02/14/22   [provider]  LORazepam (ATIVAN) 1 MG tablet Take 1 mg by mouth daily as needed.    [provider]  tiZANidine  (ZANAFLEX ) 4 MG tablet Take 1 tablet (4 mg total) by mouth every 6 (six) hours as needed for muscle spasms. 12/12/23   Mandy Second, PA    Family History Family History  Problem Relation Age of Onset   COPD Mother    Birth defects Mother    Mental illness Mother    Mental illness Father     Social  History Social History   Tobacco Use   Smoking status: Never   Smokeless tobacco: Never  Vaping Use   Vaping status: Never Used  Substance Use Topics   Alcohol use: Yes   Drug use: Never     Allergies   Patient has no known allergies.   Review of Systems Review of Systems  Constitutional:  Positive for activity change. Negative for appetite change, fatigue and fever.  Respiratory:  Negative for shortness of breath.   Cardiovascular:  Negative for chest pain and palpitations.  Gastrointestinal:  Negative for abdominal pain, diarrhea, nausea and vomiting.  Musculoskeletal:  Positive for myalgias. Negative for arthralgias.  Skin:  Positive for color change and wound.  Neurological:  Negative for dizziness, facial asymmetry, weakness, light-headedness, numbness and headaches.     Physical Exam Triage Vital Signs ED Triage Vitals  Encounter Vitals Group     BP 01/04/24 1026 133/86     Systolic BP  Percentile --      Diastolic BP Percentile --      Pulse Rate 01/04/24 1026 77     Resp --      Temp 01/04/24 1026 98.5 F (36.9 C)     Temp Source 01/04/24 1026 Oral     SpO2 01/04/24 1026 96 %     Weight 01/04/24 1026 218 lb (98.9 kg)     Height 01/04/24 1026 5\' 4"  (1.626 m)     Head Circumference --      Peak Flow --      Pain Score 01/04/24 1027 7     Pain Loc --      Pain Education --      Exclude from Growth Chart --    No data found.  Updated Vital Signs BP 133/86 (BP Location: Left Arm)   Pulse 77   Temp 98.5 F (36.9 C) (Oral)   Ht 5\' 4"  (1.626 m)   Wt 218 lb (98.9 kg)   LMP 11/17/2023 (Approximate)   SpO2 96%   Breastfeeding No   BMI 37.42 kg/m   Visual Acuity Right Eye Distance:   Left Eye Distance:   Bilateral Distance:    Right Eye Near:   Left Eye Near:    Bilateral Near:     Physical Exam Vitals reviewed.  Constitutional:      General: She is awake. She is not in acute distress.    Appearance: Normal appearance. She is well-developed. She is not ill-appearing.     Comments: Very pleasant female appears stated age in no acute distress sitting comfortably in exam room  HENT:     Head: Normocephalic and atraumatic.  Cardiovascular:     Rate and Rhythm: Normal rate and regular rhythm.     Heart sounds: Normal heart sounds, S1 normal and S2 normal. No murmur heard. Pulmonary:     Effort: Pulmonary effort is normal.     Breath sounds: Normal breath sounds. No wheezing, rhonchi or rales.     Comments: Clear to auscultation bilaterally Chest:     Chest wall: Tenderness present. No deformity or swelling.     Comments: Mild tenderness to palpation over right chest wall surrounding lesions. Musculoskeletal:     Right shoulder: No swelling, tenderness or bony tenderness. Normal range of motion.     Comments: Right shoulder: Normal active range of motion.  No focal tenderness or deformity noted.  Skin:    Findings: Lesion present.     Comments: 3  separate hyperpigmented lesions  noted on anterior chest wall 2 of which have surrounding erythema but no streaking.  No active bleeding or drainage noted.  Psychiatric:        Behavior: Behavior is cooperative.      UC Treatments / Results  Labs (all labs ordered are listed, but only abnormal results are displayed) Labs Reviewed - No data to display  EKG   Radiology No results found.  Procedures Procedures (including critical care time)  Medications Ordered in UC Medications - No data to display  Initial Impression / Assessment and Plan / UC Course  I have reviewed the triage vital signs and the nursing notes.  Pertinent labs & imaging results that were available during my care of the patient were reviewed by me and considered in my medical decision making (see chart for details).     Patient is well-appearing, afebrile, nontoxic, nontachycardic.  Concern for secondary infection given increasing soreness with surrounding erythema.  We did discuss that erythema surrounding lesions does appear some semblance to erythema migrans though she does not member take being in this area and we discussed that it would be unusual to have multiple tick bites in the same region without noticing insect.  Will cover for secondary bacterial infection with doxycycline as this would also cover for tickborne illness.  We discussed that she should avoid prolonged sun exposure while on this medication.  She states area clean and monitor area of erythema.  If this continues to spread despite medication or she will obtain additional symptoms she is to return for reevaluation.  Strict return precautions given.  All questions were answered to patient satisfaction.  Final Clinical Impressions(s) / UC Diagnoses   Final diagnoses:  Skin infection  Bug bite with infection, initial encounter     Discharge Instructions      We are treating you for a skin infection/infected bug bite.  Please start  doxycycline 100 mg twice daily for 10 days.  Stay out of the sun while on this medication.  Keep the area clean with soap and water and monitor for the area of redness.  If the redness is continuing to spread or you develop additional symptoms including fever, nausea, vomiting, additional lesions, weakness, headache, dizziness you need to be seen immediately.     ED Prescriptions     Medication Sig Dispense Auth. Provider   doxycycline (VIBRAMYCIN) 100 MG capsule Take 1 capsule (100 mg total) by mouth 2 (two) times daily. 20 capsule Laderrick Wilk K, PA-C      PDMP not reviewed this encounter.   Budd Cargo, PA-C 01/04/24 1056

## 2024-01-07 ENCOUNTER — Ambulatory Visit: Admitting: Urgent Care

## 2024-01-08 DIAGNOSIS — F411 Generalized anxiety disorder: Secondary | ICD-10-CM | POA: Diagnosis not present

## 2024-01-08 DIAGNOSIS — F900 Attention-deficit hyperactivity disorder, predominantly inattentive type: Secondary | ICD-10-CM | POA: Diagnosis not present

## 2024-01-08 DIAGNOSIS — F3181 Bipolar II disorder: Secondary | ICD-10-CM | POA: Diagnosis not present

## 2024-01-11 ENCOUNTER — Ambulatory Visit (INDEPENDENT_AMBULATORY_CARE_PROVIDER_SITE_OTHER): Admitting: Urgent Care

## 2024-01-11 VITALS — BP 119/87 | HR 103 | Wt 212.0 lb

## 2024-01-11 DIAGNOSIS — R6884 Jaw pain: Secondary | ICD-10-CM | POA: Diagnosis not present

## 2024-01-11 DIAGNOSIS — L2989 Other pruritus: Secondary | ICD-10-CM

## 2024-01-11 DIAGNOSIS — Z79899 Other long term (current) drug therapy: Secondary | ICD-10-CM | POA: Diagnosis not present

## 2024-01-11 DIAGNOSIS — G44229 Chronic tension-type headache, not intractable: Secondary | ICD-10-CM

## 2024-01-11 DIAGNOSIS — T368X5A Adverse effect of other systemic antibiotics, initial encounter: Secondary | ICD-10-CM

## 2024-01-11 DIAGNOSIS — R110 Nausea: Secondary | ICD-10-CM

## 2024-01-11 DIAGNOSIS — M549 Dorsalgia, unspecified: Secondary | ICD-10-CM | POA: Diagnosis not present

## 2024-01-11 DIAGNOSIS — F4329 Adjustment disorder with other symptoms: Secondary | ICD-10-CM

## 2024-01-11 DIAGNOSIS — F411 Generalized anxiety disorder: Secondary | ICD-10-CM | POA: Diagnosis not present

## 2024-01-11 DIAGNOSIS — Z6836 Body mass index (BMI) 36.0-36.9, adult: Secondary | ICD-10-CM

## 2024-01-11 LAB — POCT GLYCOSYLATED HEMOGLOBIN (HGB A1C)
HbA1c POC (<> result, manual entry): 5.1 % (ref 4.0–5.6)
HbA1c, POC (controlled diabetic range): 5.1 % (ref 0.0–7.0)
HbA1c, POC (prediabetic range): 5.1 % — AB (ref 5.7–6.4)
Hemoglobin A1C: 5.1 % (ref 4.0–5.6)

## 2024-01-11 MED ORDER — WEGOVY 0.25 MG/0.5ML ~~LOC~~ SOAJ: 0.2500 mg | SUBCUTANEOUS | 1 refills | Status: DC

## 2024-01-11 NOTE — Progress Notes (Unsigned)
   Established Patient Office Visit  Subjective:  Patient ID: Mia Ryan, female    DOB: [birth year]  Age: 28 y.o. MRN: [id number]  Chief Complaint  Patient presents with   Follow-up    3 week follow up. She stopped taking the meds prescribed fro headaches due to side effects. She was seen at urgent recently for an insect bite and was prescribed antibiotics.    HPI  {History (Optional):23778}  ROS: as noted in HPI  Objective:     BP 119/87   Pulse (!) 103   Wt 212 lb (96.2 kg)   LMP 11/17/2023 (Approximate)   SpO2 96%   BMI 36.39 kg/m  {Vitals History (Optional):23777}  Physical Exam     12/12/2023   10:09 AM  GAD 7 : Generalized Anxiety Score  Nervous, Anxious, on Edge 3  Control/stop worrying 2  Worry too much - different things 2  Trouble relaxing 3  Restless 1  Easily annoyed or irritable 2  Afraid - awful might happen 2  Total GAD 7 Score 15  Anxiety Difficulty Very difficult      No results found for any visits on 01/11/24.  {Labs (Optional):23779}  The ASCVD Risk score (Arnett DK, et al., 2019) failed to calculate for the following reasons:   The 2019 ASCVD risk score is only valid for ages 84 to 24  Assessment & Plan:  BMI 36.0-36.9,adult -     POCT glycosylated hemoglobin (Hb A1C)  Stress and adjustment reaction  Jaw pain, non-TMJ  Chronic tension-type headache, not intractable     No follow-ups on file.   Mandy Second, PA

## 2024-01-13 ENCOUNTER — Encounter: Payer: Self-pay | Admitting: Urgent Care

## 2024-01-13 NOTE — Patient Instructions (Signed)
 Continue buspar twice daily and all other psych meds prescribed from specialist. Continue tizanidine  on an as needed basis.  Discontinue fioricet (butalbital )  Start Wegovy injections - these are once weekly. Start with 0.25mg  and use weekly x 4 weeks. Then, increase to 0.5mg  weekly x 4 weeks.  Please schedule a 2 month follow up to assess side effect profile and efficacy of the injections. At that time, follow up will be performed at the following location:  [facility] at [facility] [address], [place], [place] [address] Phone: [phone]

## 2024-01-14 ENCOUNTER — Other Ambulatory Visit (HOSPITAL_COMMUNITY): Payer: Self-pay

## 2024-01-14 ENCOUNTER — Telehealth: Payer: Self-pay

## 2024-01-14 NOTE — Telephone Encounter (Signed)
 Pharmacy Patient Advocate Encounter   Received notification from Onbase that prior authorization for [facility] 0.25MG /0.5ML auto-injectors  is required/requested.   Insurance verification completed.   The patient is insured through [facility] .   Per test claim: PA required; PA started via CoverMyMeds. KEY [id number] . Waiting for clinical questions to populate.

## 2024-01-14 NOTE — Telephone Encounter (Signed)
 Clinical questions have been answered and PA submitted.TO PLAN. PA currently Pending.

## 2024-01-15 NOTE — Telephone Encounter (Signed)
 Pharmacy Patient Advocate Encounter  Received notification from [facility] that Prior Authorization for Wegovy 0.25MG /0.5ML auto-injectors has been APPROVED from 01/15/2024 to 08/15/2024 see approval message from plan below  Please be advised and see message for plan: Your PA request has been approved. Additional information will be provided in the approval communication. (Message 1145). Authorization Expiration Date: August 15, 2024.

## 2024-01-24 ENCOUNTER — Telehealth: Payer: Self-pay

## 2024-01-24 NOTE — Telephone Encounter (Signed)
 Is this ok?  Copied from CRM [id number]. Topic: Clinical - Medication Question >> Jan 24, 2024 11:07 AM Juleen Oakland F wrote: Reason for CRM: Patient request call back regarding wegovy , wants to know if she can switch the days of when she takes it?

## 2024-02-05 DIAGNOSIS — F900 Attention-deficit hyperactivity disorder, predominantly inattentive type: Secondary | ICD-10-CM | POA: Diagnosis not present

## 2024-02-05 DIAGNOSIS — F411 Generalized anxiety disorder: Secondary | ICD-10-CM | POA: Diagnosis not present

## 2024-02-05 DIAGNOSIS — F3181 Bipolar II disorder: Secondary | ICD-10-CM | POA: Diagnosis not present

## 2024-02-11 ENCOUNTER — Ambulatory Visit (INDEPENDENT_AMBULATORY_CARE_PROVIDER_SITE_OTHER): Admitting: Urgent Care

## 2024-02-11 ENCOUNTER — Encounter: Payer: Self-pay | Admitting: Urgent Care

## 2024-02-11 VITALS — BP 115/82 | HR 85 | Ht 64.0 in | Wt 206.8 lb

## 2024-02-11 DIAGNOSIS — Z0000 Encounter for general adult medical examination without abnormal findings: Secondary | ICD-10-CM

## 2024-02-11 DIAGNOSIS — Z114 Encounter for screening for human immunodeficiency virus [HIV]: Secondary | ICD-10-CM

## 2024-02-11 DIAGNOSIS — Z1159 Encounter for screening for other viral diseases: Secondary | ICD-10-CM

## 2024-02-11 DIAGNOSIS — Z6836 Body mass index (BMI) 36.0-36.9, adult: Secondary | ICD-10-CM | POA: Diagnosis not present

## 2024-02-11 LAB — COMPREHENSIVE METABOLIC PANEL WITH GFR
ALT: 15 U/L (ref 0–35)
AST: 13 U/L (ref 0–37)
Albumin: 4.4 g/dL (ref 3.5–5.2)
Alkaline Phosphatase: 75 U/L (ref 39–117)
BUN: 10 mg/dL (ref 6–23)
CO2: 26 meq/L (ref 19–32)
Calcium: 9.6 mg/dL (ref 8.4–10.5)
Chloride: 105 meq/L (ref 96–112)
Creatinine, Ser: 0.99 mg/dL (ref 0.40–1.20)
GFR: 77.37 mL/min (ref >60.00)
Glucose, Bld: 84 mg/dL (ref 70–99)
Potassium: 4.3 meq/L (ref 3.5–5.1)
Sodium: 138 meq/L (ref 135–145)
Total Bilirubin: 0.7 mg/dL (ref 0.2–1.2)
Total Protein: 7.8 g/dL (ref 6.0–8.3)

## 2024-02-11 LAB — LIPID PANEL
Cholesterol: 187 mg/dL (ref 0–200)
HDL: 39 mg/dL — ABNORMAL LOW (ref >39.00)
LDL Cholesterol: 126 mg/dL — ABNORMAL HIGH (ref 0–99)
NonHDL: 147.73
Total CHOL/HDL Ratio: 5
Triglycerides: 111 mg/dL (ref 0.0–149.0)
VLDL: 22.2 mg/dL (ref 0.0–40.0)

## 2024-02-11 LAB — CBC
HCT: 41.9 % (ref 36.0–46.0)
Hemoglobin: 14 g/dL (ref 12.0–15.0)
MCHC: 33.3 g/dL (ref 30.0–36.0)
MCV: 86.8 fl (ref 78.0–100.0)
Platelets: 324 10*3/uL (ref 150.0–400.0)
RBC: 4.83 Mil/uL (ref 3.87–5.11)
RDW: 14.7 % (ref 11.5–15.5)
WBC: 5.2 10*3/uL (ref 4.0–10.5)

## 2024-02-11 MED ORDER — WEGOVY 0.5 MG/0.5ML ~~LOC~~ SOAJ: 0.5000 mg | SUBCUTANEOUS | 0 refills | Status: DC

## 2024-02-11 NOTE — Patient Instructions (Signed)
 We completed your annual physical. Please continue dietary changes and exercise. Monitor your heart rate while exercising, try to get to max heart rate of 180-190 bpm.  Increase your Wegovy  to 0.5mg . Monitor for side effects For your constipation, take 17g (one capful) of miralax daily until normalcy achieved.  You are not due for your pap smear until March 2027.  Please schedule a 4-6 week follow up to monitor your response to Wegovy . Follow up will take place at a different office: [facility] at [facility] [address] [place], [place] [address] Phone: [phone]  Please call the number above to schedule.

## 2024-02-11 NOTE — Progress Notes (Signed)
 Annual Wellness Visit     Patient: Mia Ryan, Female    DOB: 1996-08-04, 28 y.o.   MRN: [id number]  Subjective  Chief Complaint  Patient presents with   Annual Exam    Fasting Cpe. Pt would also like a pap today and std screening.    Mia Ryan is a 28 y.o. female who presents today for her Annual Wellness Visit. She reports consuming a general and avoids red meat and pork diet. Home exercise routine includes stretching and jump rope. She generally feels fairly well. She reports sleeping fairly well; often wakes up because of son. She does not have additional problems to discuss today.  Doing well overall on Wegovy  - has lost 6-8#. Has nausea the day of the shot (Saturdays). Has ben feeling constipated since starting as well - going every other day instead of daily.   HPI  Vision:Within last year and Dental: No current dental problems and Receives regular dental care   Patient Active Problem List   Diagnosis Date Noted   SVD (9/29) 06/03/2019   Obesity affecting pregnancy in third trimester 06/02/2019   Past Medical History:  Diagnosis Date   Anxiety 2018   Depression 2018   Managed   Past Surgical History:  Procedure Laterality Date   TONSILLECTOMY     WISDOM TOOTH EXTRACTION     Social History   Tobacco Use   Smoking status: Never   Smokeless tobacco: Never  Vaping Use   Vaping status: Never Used  Substance Use Topics   Alcohol use: Yes   Drug use: Never      Medications: Outpatient Medications Prior to Visit  Medication Sig   amphetamine-dextroamphetamine (ADDERALL XR) 10 MG 24 hr capsule Take 10 mg by mouth daily.   busPIRone (BUSPAR) 15 MG tablet Take 15 mg by mouth 2 (two) times daily.   gabapentin (NEURONTIN) 300 MG capsule Take 600 mg by mouth at bedtime.   lamoTRIgine (LAMICTAL) 25 MG tablet Take 25 mg by mouth at bedtime.   levonorgestrel (MIRENA, 52 MG,) 20 MCG/DAY IUD by Intrauterine route.   LORazepam (ATIVAN) 1 MG tablet Take 1 mg by  mouth daily as needed.   [DISCONTINUED] WEGOVY  0.25 MG/0.5ML SOAJ Inject 0.25 mg into the skin once a week. Use this dose for 1 month (4 shots) and then increase to next higher dose.   doxycycline  (VIBRAMYCIN ) 100 MG capsule Take 1 capsule (100 mg total) by mouth 2 (two) times daily. (Patient not taking: Reported on 02/11/2024)   tiZANidine  (ZANAFLEX ) 4 MG tablet Take 1 tablet (4 mg total) by mouth every 6 (six) hours as needed for muscle spasms. (Patient not taking: Reported on 02/11/2024)   No facility-administered medications prior to visit.    No Known Allergies  Patient Care Team: Mandy Second, [place] as PCP - General (Physician Assistant)  ROS Complete 12 point ROS performed with all pertinent positives listed in HPI     Objective  BP 115/82   Pulse 85   Ht 5\' 4"  (1.626 m)   Wt 206 lb 12.8 oz (93.8 kg)   SpO2 96%   BMI 35.50 kg/m  BP Readings from Last 3 Encounters:  02/11/24 115/82  01/11/24 119/87  01/04/24 133/86   Wt Readings from Last 3 Encounters:  02/11/24 206 lb 12.8 oz (93.8 kg)  01/11/24 212 lb (96.2 kg)  01/04/24 218 lb (98.9 kg)      Physical Exam Vitals and nursing note reviewed.  Constitutional:  General: She is not in acute distress.    Appearance: Normal appearance. She is not ill-appearing, toxic-appearing or diaphoretic.  HENT:     Head: Normocephalic and atraumatic.     Right Ear: Tympanic membrane, ear canal and external ear normal. There is no impacted cerumen.     Left Ear: Tympanic membrane, ear canal and external ear normal. There is no impacted cerumen.     Nose: Nose normal. No congestion or rhinorrhea.     Mouth/Throat:     Mouth: Mucous membranes are moist.     Pharynx: Oropharynx is clear. No oropharyngeal exudate or posterior oropharyngeal erythema.  Eyes:     General: No scleral icterus.       Right eye: No discharge.        Left eye: No discharge.     Extraocular Movements: Extraocular movements intact.     Pupils: Pupils  are equal, round, and reactive to light.  Neck:     Thyroid: No thyroid mass, thyromegaly or thyroid tenderness.  Cardiovascular:     Rate and Rhythm: Normal rate and regular rhythm.     Pulses: Normal pulses.     Heart sounds: No murmur heard. Pulmonary:     Effort: Pulmonary effort is normal. No respiratory distress.     Breath sounds: Normal breath sounds. No stridor. No wheezing or rhonchi.  Abdominal:     General: Abdomen is flat. Bowel sounds are normal. There is no distension.     Palpations: Abdomen is soft. There is no mass.     Tenderness: There is no abdominal tenderness. There is no guarding or rebound.     Hernia: No hernia is present.  Musculoskeletal:     Cervical back: Normal range of motion and neck supple. No rigidity or tenderness.     Right lower leg: No edema.     Left lower leg: No edema.  Lymphadenopathy:     Cervical: No cervical adenopathy.  Skin:    General: Skin is warm and dry.     Coloration: Skin is not jaundiced.     Findings: No bruising, erythema or rash.  Neurological:     General: No focal deficit present.     Mental Status: She is alert and oriented to person, place, and time.     Sensory: No sensory deficit.     Motor: No weakness.     Gait: Gait normal.  Psychiatric:        Mood and Affect: Mood normal.        Behavior: Behavior normal.       Most recent functional status assessment:     No data to display         Most recent fall risk assessment:    02/11/2024    8:08 AM  Fall Risk   Falls in the past year? 0  Number falls in past yr: 0  Injury with Fall? 0  Risk for fall due to : No Fall Risks  Follow up Falls evaluation completed    Most recent depression screenings:    02/11/2024    8:09 AM 12/12/2023   10:08 AM  PHQ 2/9 Scores  PHQ - 2 Score 0 1  PHQ- 9 Score 2 10   Most recent cognitive screening:     No data to display         Most recent Audit-C alcohol use screening    12/05/2023    2:17 PM  Alcohol  Use Disorder Test (  AUDIT)  1. How often do you have a drink containing alcohol? 1  2. How many drinks containing alcohol do you have on a typical day when you are drinking? 0  3. How often do you have six or more drinks on one occasion? 0  AUDIT-C Score 1      Patient-reported   A score of 3 or more in women, and 4 or more in men indicates increased risk for alcohol abuse, EXCEPT if all of the points are from question 1   Vision/Hearing Screen: No results found.  Last CBC Lab Results  Component Value Date   WBC 11.6 (H) 06/03/2019   HGB 11.1 (L) 06/03/2019   HCT 34.1 (L) 06/03/2019   MCV 85.0 06/03/2019   MCH 27.7 06/03/2019   RDW 14.5 06/03/2019   PLT 284 06/03/2019   Last metabolic panel Lab Results  Component Value Date   CREATININE 0.84 06/03/2019   GFRNONAA >60 06/03/2019   Last lipids No results found for: "CHOL", "HDL", "LDLCALC", "LDLDIRECT", "TRIG", "CHOLHDL" Last hemoglobin A1c Lab Results  Component Value Date   HGBA1C 5.1 01/11/2024   HGBA1C 5.1 01/11/2024   HGBA1C 5.1 (A) 01/11/2024   HGBA1C 5.1 01/11/2024      No results found for any visits on 02/11/24.    Assessment & Plan   Annual wellness visit done today including the all of the following: Reviewed patient's Family Medical History Reviewed and updated list of patient's medical providers Assessment of cognitive impairment was done Assessed patient's functional ability Established a written schedule for health screening services Health Risk Assessent Completed and Reviewed  Exercise Activities and Dietary recommendations  Goals   Weight loss - goal is 170# by November 2025     Immunization History  Administered Date(s) Administered   DTaP 10/10/1996, 03/22/1999   DTaP / HiB 04/17/1995, 07/18/1995, 02/12/1996   HIB, Unspecified 10/10/1996   Hepatitis A, Ped/Adol-2 Dose 05/15/2005, 12/28/2005   Hepatitis B, PED/ADOLESCENT 1996-04[birth year], 04/17/1995, 01/14/1996   MMR 10/10/1996,  03/22/1999, 06/05/2019   OPV 04/17/1995, 07/18/1995, 10/09/1995, 03/22/1999   Tdap 05/15/2005   Varicella 03/22/1999, 05/15/2005    Health Maintenance  Topic Date Due   Hepatitis C Screening  Never done   DTaP/Tdap/Td (7 - Td or Tdap) 05/16/2015   COVID-19 Vaccine (1) 01/01/2025 (Originally 03/05/2000)   INFLUENZA VACCINE  04/04/2024   Cervical Cancer Screening (Pap smear)  11/16/2025   HIV Screening  Completed   HPV VACCINES  Aged Out   Meningococcal B Vaccine  Aged Out     Discussed health benefits of physical activity, and encouraged her to engage in regular exercise appropriate for her age and condition.    Problem List Items Addressed This Visit   None Visit Diagnoses       Well adult health check    -  Primary   Relevant Orders   CBC   Comprehensive metabolic panel with GFR   Lipid panel   TSH     Need for hepatitis C screening test       Relevant Orders   Hepatitis C antibody     Encounter for screening for HIV       Relevant Orders   HIV Antibody (routine testing w rflx)     BMI 36.0-36.9,adult       Relevant Medications   WEGOVY  0.5 MG/0.5ML SOAJ      Wellness exam updated today. Pap smear UTD. Last Tdap during pregnancy in 2020. Pt has mirena, may need change  soon.   Increase wegovy  to 0.5mg  weekly. F/U in 4-6 weeks. Trial of 17g miralax daily until normal bowel movements achieved.  Transition son to his own bed in your bedroom to see if this improves overall sleep quality.   Return in about 4 weeks (around 03/10/2024).     Mandy Second, PA

## 2024-02-12 LAB — HIV ANTIBODY (ROUTINE TESTING W REFLEX): HIV 1&2 Ab, 4th Generation: NONREACTIVE

## 2024-02-12 LAB — HEPATITIS C ANTIBODY: Hepatitis C Ab: NONREACTIVE

## 2024-02-14 LAB — TSH: TSH: 0.69 u[IU]/mL (ref 0.35–5.50)

## 2024-02-16 ENCOUNTER — Ambulatory Visit: Payer: Self-pay | Admitting: Urgent Care

## 2024-03-04 DIAGNOSIS — F411 Generalized anxiety disorder: Secondary | ICD-10-CM | POA: Diagnosis not present

## 2024-03-04 DIAGNOSIS — F900 Attention-deficit hyperactivity disorder, predominantly inattentive type: Secondary | ICD-10-CM | POA: Diagnosis not present

## 2024-03-04 DIAGNOSIS — F3181 Bipolar II disorder: Secondary | ICD-10-CM | POA: Diagnosis not present

## 2024-03-20 ENCOUNTER — Other Ambulatory Visit: Payer: Self-pay

## 2024-03-20 DIAGNOSIS — Z6836 Body mass index (BMI) 36.0-36.9, adult: Secondary | ICD-10-CM

## 2024-03-24 ENCOUNTER — Ambulatory Visit (INDEPENDENT_AMBULATORY_CARE_PROVIDER_SITE_OTHER): Admitting: Urgent Care

## 2024-03-24 ENCOUNTER — Encounter: Payer: Self-pay | Admitting: Urgent Care

## 2024-03-24 VITALS — BP 120/83 | HR 81 | Resp 17 | Ht 64.0 in | Wt 198.0 lb

## 2024-03-24 DIAGNOSIS — Z6836 Body mass index (BMI) 36.0-36.9, adult: Secondary | ICD-10-CM

## 2024-03-24 DIAGNOSIS — Z6833 Body mass index (BMI) 33.0-33.9, adult: Secondary | ICD-10-CM | POA: Diagnosis not present

## 2024-03-24 DIAGNOSIS — R194 Change in bowel habit: Secondary | ICD-10-CM | POA: Diagnosis not present

## 2024-03-24 MED ORDER — WEGOVY 0.5 MG/0.5ML ~~LOC~~ SOAJ: 0.5000 mg | SUBCUTANEOUS | 3 refills | Status: DC

## 2024-03-24 NOTE — Patient Instructions (Signed)
 Great job on the weight loss!  Continue the 0.5mg  weekly injection for now. Goal is to continue 8# weight loss per month until your goal of 170#.  If your weight starts to plateau on the medication, let me know and we can adjust the dose. Follow up around the end of October.

## 2024-03-24 NOTE — Progress Notes (Unsigned)
   Established Patient Office Visit  Subjective:  Patient ID: Mia Ryan, female    DOB: 1995-12-04  Age: 28 y.o. MRN: [id number]  Chief Complaint  Patient presents with   Medical Management of Chronic Issues    Medication mangement    HPI  Patient Active Problem List   Diagnosis Date Noted   SVD (9/29) 06/03/2019   Obesity affecting pregnancy in third trimester 06/02/2019   Past Medical History:  Diagnosis Date   Anxiety 2018   Depression 2018   Managed   Past Surgical History:  Procedure Laterality Date   TONSILLECTOMY     WISDOM TOOTH EXTRACTION     Social History   Tobacco Use   Smoking status: Never   Smokeless tobacco: Never  Vaping Use   Vaping status: Never Used  Substance Use Topics   Alcohol use: Yes   Drug use: Never      ROS: as noted in HPI  Objective:     BP 120/83 (BP Location: Left Arm, Patient Position: Sitting, Cuff Size: Large)   Pulse 81   Resp 17   Ht 5' 4 (1.626 m)   Wt 198 lb (89.8 kg)   SpO2 100%   BMI 33.99 kg/m  BP Readings from Last 3 Encounters:  03/24/24 120/83  02/11/24 115/82  01/11/24 119/87   Wt Readings from Last 3 Encounters:  03/24/24 198 lb (89.8 kg)  02/11/24 206 lb 12.8 oz (93.8 kg)  01/11/24 212 lb (96.2 kg)      Physical Exam   No results found for any visits on 03/24/24.  Last CBC Lab Results  Component Value Date   WBC 5.2 02/11/2024   HGB 14.0 02/11/2024   HCT 41.9 02/11/2024   MCV 86.8 02/11/2024   MCH 27.7 06/03/2019   RDW 14.7 02/11/2024   PLT 324.0 02/11/2024   Last metabolic panel Lab Results  Component Value Date   GLUCOSE 84 02/11/2024   NA 138 02/11/2024   K 4.3 02/11/2024   CL 105 02/11/2024   CO2 26 02/11/2024   BUN 10 02/11/2024   CREATININE 0.99 02/11/2024   GFR 77.37 02/11/2024   CALCIUM 9.6 02/11/2024   PROT 7.8 02/11/2024   ALBUMIN 4.4 02/11/2024   BILITOT 0.7 02/11/2024   ALKPHOS 75 02/11/2024   AST 13 02/11/2024   ALT 15 02/11/2024      The ASCVD Risk  score (Arnett DK, et al., 2019) failed to calculate for the following reasons:   The 2019 ASCVD risk score is only valid for ages 87 to 26  Assessment & Plan:  There are no diagnoses linked to this encounter.   No follow-ups on file.   Benton LITTIE Gave, PA

## 2024-03-25 ENCOUNTER — Encounter: Payer: Self-pay | Admitting: Urgent Care

## 2024-05-15 ENCOUNTER — Encounter: Payer: Self-pay | Admitting: Urgent Care

## 2024-05-16 MED ORDER — WEGOVY 1 MG/0.5ML ~~LOC~~ SOAJ: 1.0000 mg | SUBCUTANEOUS | 4 refills | Status: DC

## 2024-05-20 NOTE — Progress Notes (Signed)
 Subjective:    Mia Ryan is a 28 y.o. (DOB May 19, 1996) female.     Patient presents with  . Vaginal Discharge    Pt presents today with c/o vaginal discharge      28  YO FEMALE PT HERE FOR A FEW DAYS OF VAGINAL DISCHARGE.   SHE ENDORSES AND ODOR AND THE D/C IS CLOUDY AND THINNISH.  SHE HAS AN IUD, AND SINCE HAVING HE IUD, SHE HAS BEEN GETTING BV MORE OFTEN.   SHE HAS USED BORIC ACID SUPPOSITORIES OCCASSIONALLY, BUT CANNOT UNDERSTAND WHY NOT MORE HELPFUL.     Vaginal Discharge The patient's primary symptoms include a genital odor and vaginal discharge. The patient's pertinent negatives include no genital itching, genital lesions, genital rash or vaginal bleeding. This is a recurrent problem. The current episode started in the past 7 days. The problem occurs constantly. The problem has been unchanged. The patient is experiencing no pain. She is not pregnant. Pertinent negatives include no abdominal pain, anorexia, back pain, discolored urine, dysuria, flank pain, frequency, headaches, hematuria, nausea or urgency. The vaginal discharge was milky. There has been no bleeding. She has not been passing clots. She has not been passing tissue. Nothing aggravates the symptoms. Treatments tried: BORIC Acid. The treatment provided no relief. She is sexually active. She uses an IUD for contraception. There is no history of an abdominal surgery or a gynecological surgery.     Reviewed and updated this visit by provider: None       Review of Systems  Constitutional: Negative.   HENT: Negative.    Eyes: Negative.   Respiratory: Negative.    Cardiovascular: Negative.   Gastrointestinal: Negative.  Negative for abdominal pain, anorexia and nausea.  Genitourinary:  Positive for vaginal discharge. Negative for dysuria, flank pain, frequency, hematuria and urgency.  Musculoskeletal: Negative.  Negative for back pain.  Skin: Negative.   Neurological: Negative.  Negative for headaches.   Psychiatric/Behavioral: Negative.        Objective:   Vitals:   05/20/24 1011  BP: 120/78  Pulse: 101  Temp: 97.3 F (36.3 C)  TempSrc: Tympanic  Resp: 16  Height: 5' 3 (1.6 m)  Weight: 196 lb (88.9 kg)  SpO2: 96%  BMI (Calculated): 34.7   Physical Exam Vitals and nursing note reviewed.  Constitutional:      General: She is not in acute distress.    Appearance: Normal appearance.  HENT:     Head: Normocephalic and atraumatic.     Right Ear: External ear normal.     Left Ear: External ear normal.     Nose: Nose normal.  Eyes:     Extraocular Movements: Extraocular movements intact.     Conjunctiva/sclera: Conjunctivae normal.  Cardiovascular:     Rate and Rhythm: Normal rate and regular rhythm.     Pulses: Normal pulses.  Musculoskeletal:        General: Normal range of motion.     Cervical back: Normal range of motion and neck supple. No rigidity.  Pulmonary:     Effort: Pulmonary effort is normal.     Breath sounds: Normal breath sounds.  Abdominal:     Tenderness: There is no right CVA tenderness or left CVA tenderness.  Genitourinary:    Comments: EXAM DEFERRED TO GYN  Skin:    General: Skin is warm and dry.  Neurological:     General: No focal deficit present.     Mental Status: She is alert and oriented to person, place,  and time.     Cranial Nerves: No cranial nerve deficit.  Psychiatric:        Mood and Affect: Mood normal.        Behavior: Behavior normal.        Assessment / Plan:   Assessment 1. Vaginal discharge (Primary) -     POC Urine Dipstick -     BV, Candida, TV, CT, & GC, NAA vag swab Vaginal Vaginal    Plan  -  PT WILL PICK UP RX AND TAKE AS DIRECTED PENDING RETURN OF RESULTS   Risks, benefits, and alternatives of the medications and treatment plan prescribed today were discussed, and patient expressed understanding. Plan follow-up as discussed or as needed if any worsening symptoms or change in condition.

## 2024-07-14 ENCOUNTER — Ambulatory Visit: Admitting: Urgent Care

## 2024-07-18 ENCOUNTER — Ambulatory Visit: Admitting: Urgent Care

## 2024-07-25 ENCOUNTER — Ambulatory Visit (INDEPENDENT_AMBULATORY_CARE_PROVIDER_SITE_OTHER): Admitting: Urgent Care

## 2024-07-25 VITALS — BP 117/79 | HR 92 | Ht 64.0 in | Wt 182.0 lb

## 2024-07-25 DIAGNOSIS — F4329 Adjustment disorder with other symptoms: Secondary | ICD-10-CM | POA: Diagnosis not present

## 2024-07-25 DIAGNOSIS — Z6831 Body mass index (BMI) 31.0-31.9, adult: Secondary | ICD-10-CM | POA: Diagnosis not present

## 2024-07-25 MED ORDER — WEGOVY 1.7 MG/0.75ML ~~LOC~~ SOAJ: 1.7000 mg | SUBCUTANEOUS | 2 refills | Status: AC

## 2024-07-25 NOTE — Progress Notes (Unsigned)
   Established Patient Office Visit  Subjective:  Patient ID: Mia Ryan, female    DOB: December 28, 1995  Age: 28 y.o. 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 06/02/2019   Past Medical History:  Diagnosis Date   Anxiety 2018   Depression 2018   Managed   Past Surgical History:  Procedure Laterality Date   TONSILLECTOMY     WISDOM TOOTH EXTRACTION     Social History   Tobacco Use   Smoking status: Never   Smokeless tobacco: Never  Vaping Use   Vaping status: Never Used  Substance Use Topics   Alcohol use: Yes   Drug use: Never      ROS: as noted in HPI  Objective:     BP 117/79   Pulse 92   Ht 5' 4 (1.626 m)   Wt 182 lb (82.6 kg)   SpO2 100%   BMI 31.24 kg/m  BP Readings from Last 3 Encounters:  07/25/24 117/79  03/24/24 120/83  02/11/24 115/82   Wt Readings from Last 3 Encounters:  07/25/24 182 lb (82.6 kg)  03/24/24 198 lb (89.8 kg)  02/11/24 206 lb 12.8 oz (93.8 kg)      Physical Exam   No results found for any visits on 07/25/24.  Last CBC Lab Results  Component Value Date   WBC 5.2 02/11/2024   HGB 14.0 02/11/2024   HCT 41.9 02/11/2024   MCV 86.8 02/11/2024   MCH 27.7 06/03/2019   RDW 14.7 02/11/2024   PLT 324.0 02/11/2024   Last metabolic panel Lab Results  Component Value Date   GLUCOSE 84 02/11/2024   NA 138 02/11/2024   K 4.3 02/11/2024   CL 105 02/11/2024   CO2 26 02/11/2024   BUN 10 02/11/2024   CREATININE 0.99 02/11/2024   GFR 77.37 02/11/2024   CALCIUM 9.6 02/11/2024   PROT 7.8 02/11/2024   ALBUMIN 4.4 02/11/2024   BILITOT 0.7 02/11/2024   ALKPHOS 75 02/11/2024   AST 13 02/11/2024   ALT 15 02/11/2024   Last lipids Lab Results  Component Value Date   CHOL 187 02/11/2024   HDL 39.00 (L) 02/11/2024   LDLCALC 126 (H) 02/11/2024   TRIG  111.0 02/11/2024   CHOLHDL 5 02/11/2024   Last hemoglobin A1c Lab Results  Component Value Date   HGBA1C 5.1 01/11/2024   HGBA1C 5.1 01/11/2024   HGBA1C 5.1 (A) 01/11/2024   HGBA1C 5.1 01/11/2024   Last thyroid  functions Lab Results  Component Value Date   TSH 0.69 02/11/2024   Last vitamin D No results found for: 25OHVITD2, 25OHVITD3, VD25OH Last vitamin B12 and Folate No results found for: VITAMINB12, FOLATE    The ASCVD Risk score (Arnett DK, et al., 2019) failed to calculate for the following reasons:   The 2019 ASCVD risk score is only valid for ages 31 to 63  Assessment & Plan:  There are no diagnoses linked to this encounter.   No follow-ups on file.   Benton LITTIE Gave, PA

## 2024-07-25 NOTE — Patient Instructions (Signed)
 Please stop the 1mg  weekly and increase to the 1.7mg  weekly injection. We will stay at the 1.7mg  dose until you plateau or no longer lose weight.  Please try to incorporate my physical activity throughout your day.  Look into beach body - excellent form of physical activity that you can do in the comfort of your own home, and during breaks while at work.  Follow up again in 6 months for annual, sooner if needed

## 2024-07-26 ENCOUNTER — Encounter: Payer: Self-pay | Admitting: Urgent Care

## 2024-08-19 ENCOUNTER — Other Ambulatory Visit (HOSPITAL_COMMUNITY): Payer: Self-pay

## 2024-08-19 ENCOUNTER — Telehealth: Payer: Self-pay

## 2024-08-19 NOTE — Telephone Encounter (Signed)
 Pharmacy Patient Advocate Encounter   Received notification from Onbase that prior authorization for Wegovy  0.25MG /0.5ML auto-injectors  is required/requested.   Insurance verification completed.   The patient is insured through [facility].   Per test claim: PA required; PA submitted to above mentioned insurance via Latent Key/confirmation #/[id number] [facility] Status is pending

## 2024-08-20 ENCOUNTER — Encounter: Payer: Self-pay | Admitting: Urgent Care

## 2024-08-22 ENCOUNTER — Other Ambulatory Visit (HOSPITAL_COMMUNITY): Payer: Self-pay

## 2024-08-22 NOTE — Telephone Encounter (Signed)
 Pharmacy Patient Advocate Encounter  Received notification from [facility] that Prior Authorization for semaglutide -weight management (WEGOVY ) 1.7 MG/0.75ML SOAJ SQ injection  has been APPROVED from 08/22/2024 to 08/22/2025   PA #/Case ID/Reference #: [id number]
